# Patient Record
Sex: Female | Born: 1970 | Race: Black or African American | Hispanic: No | Marital: Single | Smoking: Never smoker
Health system: Southern US, Community
[De-identification: ages and names within clinical notes are randomized; demographics above are authoritative.]

## PROBLEM LIST (undated history)

## (undated) DIAGNOSIS — E669 Obesity, unspecified: Secondary | ICD-10-CM

## (undated) HISTORY — PX: CHOLECYSTECTOMY: SHX55

---

## 2015-04-12 ENCOUNTER — Encounter (HOSPITAL_COMMUNITY): Payer: Self-pay

## 2015-04-12 ENCOUNTER — Emergency Department (HOSPITAL_COMMUNITY): Payer: Self-pay

## 2015-04-12 ENCOUNTER — Emergency Department (HOSPITAL_COMMUNITY)
Admission: EM | Admit: 2015-04-12 | Discharge: 2015-04-12 | Disposition: A | Discharge status: Home / Self Care | Payer: Self-pay | Attending: Emergency Medicine | Admitting: Emergency Medicine

## 2015-04-12 DIAGNOSIS — R809 Proteinuria, unspecified: Secondary | ICD-10-CM | POA: Insufficient documentation

## 2015-04-12 DIAGNOSIS — R300 Dysuria: Secondary | ICD-10-CM | POA: Insufficient documentation

## 2015-04-12 DIAGNOSIS — K047 Periapical abscess without sinus: Secondary | ICD-10-CM | POA: Insufficient documentation

## 2015-04-12 DIAGNOSIS — J40 Bronchitis, not specified as acute or chronic: Secondary | ICD-10-CM

## 2015-04-12 DIAGNOSIS — J209 Acute bronchitis, unspecified: Secondary | ICD-10-CM | POA: Insufficient documentation

## 2015-04-12 LAB — URINALYSIS, ROUTINE W REFLEX MICROSCOPIC
BILIRUBIN URINE: NEGATIVE
GLUCOSE, UA: NEGATIVE mg/dL
HGB URINE DIPSTICK: NEGATIVE
KETONES UR: NEGATIVE mg/dL
LEUKOCYTES UA: NEGATIVE
Nitrite: NEGATIVE
PH: 5 (ref 5.0–8.0)
PROTEIN: 100 mg/dL — AB
Specific Gravity, Urine: 1.036 — ABNORMAL HIGH (ref 1.005–1.030)

## 2015-04-12 LAB — URINE MICROSCOPIC-ADD ON

## 2015-04-12 MED ORDER — OXYCODONE-ACETAMINOPHEN 5-325 MG PO TABS: 1.0000 | ORAL_TABLET | ORAL | Status: DC | PRN

## 2015-04-12 MED ORDER — PREDNISONE 20 MG PO TABS: 40.0000 mg | ORAL_TABLET | Freq: Every day | ORAL | Status: DC

## 2015-04-12 MED ORDER — PENICILLIN V POTASSIUM 250 MG PO TABS: 500.0000 mg | ORAL_TABLET | Freq: Three times a day (TID) | ORAL | Status: DC

## 2015-04-12 NOTE — ED Notes (Signed)
Patient has a non productive cough and sinus congestion x 1 week. Patient c/o frontal headache and dental pain right lower and left upper x 1 month. Patient states she has intermittent facial swelling, but none today. Patient also c/o dysuria and urinary frequency x 2 months. Patient denies any vaginal discharge

## 2015-04-12 NOTE — ED Provider Notes (Signed)
CSN: [id number]     Arrival date & time 04/12/15  1552 History  By signing my name below, I, Freida Busman, attest that this documentation has been prepared under the direction and in the presence of non-physician practitioner, Arthor Captain, PA-C. Electronically Signed: Freida Busman, Scribe. 04/12/2015. 5:48 PM.    Chief Complaint  Patient presents with  . Cough  . Headache  . Dysuria  . Dental Pain    The history is provided by the patient. No language interpreter was used.     HPI Comments:  Carol Larson is a 45 y.o. female who presents to the Emergency Department with multiple complaints. Pt complains of a deep productive cough with clear phlegm x ~ 1 week. Pt notes her symptom was preceded by a sore throat that has resolved. She reports associated CP secondary to cough and fever with TMAX of 103. Pt notes sick contacts with similar symptoms; states her friend was diagnosed with the flu.   Pt also complains of right lower dental pain that is actively draining from site x ~1 month and intermittent episodes of dysuria x 2 months. LNMP was 03/13/15. Pt is not sexually active.  No alleviating factors noted.  History reviewed. No pertinent past medical history. Past Surgical History  Procedure Laterality Date  . Cholecystectomy     Family History  Problem Relation Age of Onset  . Hypertension Mother   . Diabetes Father    Social History  Substance Use Topics  . Smoking status: Never Smoker   . Smokeless tobacco: Never Used  . Alcohol Use: No   OB History    No data available     Review of Systems  Constitutional: Positive for fever. Negative for chills.  HENT: Positive for dental problem and sore throat (resolved).   Respiratory: Positive for cough.   Cardiovascular: Positive for chest pain (secondary to cough).  Genitourinary: Positive for dysuria.    Allergies  Phenergan  Home Medications   Prior to Admission medications   Not on File   BP 111/77 mmHg  Pulse  79  Temp(Src) 99.4 F (37.4 C) (Oral)  Resp 18  Ht  (1.727 m)  Wt 255 lb (115.667 kg)  BMI 38.78 kg/m2  SpO2 100%  LMP 03/13/2015 Physical Exam  Constitutional: She is oriented to person, place, and time. She appears well-developed and well-nourished. No distress.  HENT:  Head: Normocephalic and atraumatic.  Mouth/Throat: Oropharynx is clear and moist.  2nd right lower molar decayed under gumline. There is significant ginvigival erythema and swelling with exquisite ttp   Eyes: Conjunctivae are normal.  Cardiovascular: Normal rate, regular rhythm and normal heart sounds.   Pulmonary/Chest: Effort normal. No respiratory distress. She has no wheezes.  Pt with thick and rhonchorous cough  Abdominal: She exhibits no distension.  Neurological: She is alert and oriented to person, place, and time.  Skin: Skin is warm and dry.  Psychiatric: She has a normal mood and affect.  Nursing note and vitals reviewed.   ED Course  Procedures   DIAGNOSTIC STUDIES:  Oxygen Saturation is 100% on RA, normal by my interpretation.    COORDINATION OF CARE:  5:43 PM Discussed treatment plan with pt at bedside and pt agreed to plan.  Labs Review Labs Reviewed  URINALYSIS, ROUTINE W REFLEX MICROSCOPIC (NOT AT [facility])    Imaging Review No results found. I have personally reviewed and evaluated these images and lab results as part of my medical decision-making.   MDM  Final diagnoses:  Bronchitis  Dental infection  Dysuria  Proteinuria   Patient with toothache.  No gross abscess.  Exam unconcerning for Ludwig's angina or spread of infection.  Will treat with penicillin and pain medicine.  Urged patient to follow-up with dentist.     Pt CXR negative for acute infiltrate. Patients symptoms are consistent with URI, likely viral etiology. Discussed that antibiotics are not indicated for viral infections. Pt will be discharged with symptomatic treatment.  Verbalizes understanding and is  agreeable with plan. Pt is hemodynamically stable & in NAD prior to dc.  Discussed need for follow up regardig proteinuria  I personally performed the services described in this documentation, which was scribed in my presence. The recorded information has been reviewed and is accurate.       Arthor Captain, PA-C 04/12/15 2052  Azalia Bilis, MD 04/13/15 [phone]

## 2015-04-12 NOTE — Discharge Instructions (Signed)
Dental Abscess A dental abscess is a collection of pus in or around a tooth. CAUSES This condition is caused by a bacterial infection around the root of the tooth that involves the inner part of the tooth (pulp). It may result from:  Severe tooth decay.  Trauma to the tooth that allows bacteria to enter into the pulp, such as a broken or chipped tooth.  Severe gum disease around a tooth. SYMPTOMS Symptoms of this condition include:  Severe pain in and around the infected tooth.  Swelling and redness around the infected tooth, in the mouth, or in the face.  Tenderness.  Pus drainage.  Bad breath.  Bitter taste in the mouth.  Difficulty swallowing.  Difficulty opening the mouth.  Nausea.  Vomiting.  Chills.  Swollen neck glands.  Fever. DIAGNOSIS This condition is diagnosed with examination of the infected tooth. During the exam, your dentist may tap on the infected tooth. Your dentist will also ask about your medical and dental history and may order X-rays. TREATMENT This condition is treated by eliminating the infection. This may be done with:  Antibiotic medicine.  A root canal. This may be performed to save the tooth.  Pulling (extracting) the tooth. This may also involve draining the abscess. This is done if the tooth cannot be saved. HOME CARE INSTRUCTIONS  Take medicines only as directed by your dentist.  If you were prescribed antibiotic medicine, finish all of it even if you start to feel better.  Rinse your mouth (gargle) often with salt water to relieve pain or swelling.  Do not drive or operate heavy machinery while taking pain medicine.  Do not apply heat to the outside of your mouth.  Keep all follow-up visits as directed by your dentist. This is important. SEEK MEDICAL CARE IF:  Your pain is worse and is not helped by medicine. SEEK IMMEDIATE MEDICAL CARE IF:  You have a fever or chills.  Your symptoms suddenly get worse.  You have a  very bad headache.  You have problems breathing or swallowing.  You have trouble opening your mouth.  You have swelling in your neck or around your eye.   This information is not intended to replace advice given to you by your health care provider. Make sure you discuss any questions you have with your health care provider.   Document Released: 02/11/2005 Document Revised: 06/28/2014 Document Reviewed: 02/08/2014 Elsevier Interactive Patient Education 2016 [employer]  Upper Respiratory Infection, Adult Most upper respiratory infections (URIs) are a viral infection of the air passages leading to the lungs. A URI affects the nose, throat, and upper air passages. The most common type of URI is nasopharyngitis and is typically referred to as "the common cold." URIs run their course and usually go away on their own. Most of the time, a URI does not require medical attention, but sometimes a bacterial infection in the upper airways can follow a viral infection. This is called a secondary infection. Sinus and middle ear infections are common types of secondary upper respiratory infections. Bacterial pneumonia can also complicate a URI. A URI can worsen asthma and chronic obstructive pulmonary disease (COPD). Sometimes, these complications can require emergency medical care and may be life threatening.  CAUSES Almost all URIs are caused by viruses. A virus is a type of germ and can spread from one person to another.  RISKS FACTORS You may be at risk for a URI if:   You smoke.   You have chronic  heart or lung disease.  You have a weakened defense (immune) system.   You are very young or very old.   You have nasal allergies or asthma.  You work in crowded or poorly ventilated areas.  You work in health care facilities or schools. SIGNS AND SYMPTOMS  Symptoms typically develop 2-3 days after you come in contact with a cold virus. Most viral URIs last 7-10 days. However, viral URIs  from the influenza virus (flu virus) can last 14-18 days and are typically more severe. Symptoms may include:   Runny or stuffy (congested) nose.   Sneezing.   Cough.   Sore throat.   Headache.   Fatigue.   Fever.   Loss of appetite.   Pain in your forehead, behind your eyes, and over your cheekbones (sinus pain).  Muscle aches.  DIAGNOSIS  Your health care provider may diagnose a URI by:  Physical exam.  Tests to check that your symptoms are not due to another condition such as:  Strep throat.  Sinusitis.  Pneumonia.  Asthma. TREATMENT  A URI goes away on its own with time. It cannot be cured with medicines, but medicines may be prescribed or recommended to relieve symptoms. Medicines may help:  Reduce your fever.  Reduce your cough.  Relieve nasal congestion. HOME CARE INSTRUCTIONS   Take medicines only as directed by your health care provider.   Gargle warm saltwater or take cough drops to comfort your throat as directed by your health care provider.  Use a warm mist humidifier or inhale steam from a shower to increase air moisture. This may make it easier to breathe.  Drink enough fluid to keep your urine clear or pale yellow.   Eat soups and other clear broths and maintain good nutrition.   Rest as needed.   Return to work when your temperature has returned to normal or as your health care provider advises. You may need to stay home longer to avoid infecting others. You can also use a face mask and careful hand washing to prevent spread of the virus.  Increase the usage of your inhaler if you have asthma.   Do not use any tobacco products, including cigarettes, chewing tobacco, or electronic cigarettes. If you need help quitting, ask your health care provider. PREVENTION  The best way to protect yourself from getting a cold is to practice good hygiene.   Avoid oral or hand contact with people with cold symptoms.   Wash your hands  often if contact occurs.  There is no clear evidence that vitamin C, vitamin E, echinacea, or exercise reduces the chance of developing a cold. However, it is always recommended to get plenty of rest, exercise, and practice good nutrition.  SEEK MEDICAL CARE IF:   You are getting worse rather than better.   Your symptoms are not controlled by medicine.   You have chills.  You have worsening shortness of breath.  You have brown or red mucus.  You have yellow or brown nasal discharge.  You have pain in your face, especially when you bend forward.  You have a fever.  You have swollen neck glands.  You have pain while swallowing.  You have white areas in the back of your throat. SEEK IMMEDIATE MEDICAL CARE IF:   You have severe or persistent:  Headache.  Ear pain.  Sinus pain.  Chest pain.  You have chronic lung disease and any of the following:  Wheezing.  Prolonged cough.  Coughing up  blood.  A change in your usual mucus.  You have a stiff neck.  You have changes in your:  Vision.  Hearing.  Thinking.  Mood. MAKE SURE YOU:   Understand these instructions.  Will watch your condition.  Will get help right away if you are not doing well or get worse.   This information is not intended to replace advice given to you by your health care provider. Make sure you discuss any questions you have with your health care provider.   Document Released: 08/07/2000 Document Revised: 06/28/2014 Document Reviewed: 05/19/2013 Elsevier Interactive Patient Education 2016 [employer].  Proteinuria Proteinuria is a condition in which urine contains more protein than is normal. Proteinuria is either a sign that your body is producing too much protein or a sign that there is a problem with the kidneys. Healthy kidneys prevent most substances that the body needs, including proteins, from leaving the bloodstream and ending up in urine. CAUSES  Proteinuria may be  caused by a temporary event or condition such as stress, exercise, or fever, and go away on its own. Proteinuria may also be a symptom of a more serious condition or disease. Causes of proteinuria include:  A kidney disease caused by:  Diabetes.  High blood pressure (hypertension).   A disease that affects the immune system, such as lupus.  A genetic disease, such as Alport's syndrome.  Medicines that damage the kidneys, such as long-term nonsteroidal anti-inflammatory drugs (NSAIDs).  Poisoning or exposure to toxic substances.  A reoccurring kidney or urinary infection.  Excess protein production in the body caused by:  Multiple myeloma.  Amyloidosis. SYMPTOMS You may have proteinuria without having noticeable symptoms. If there is a large amount of protein in your urine, your urine may look foamy. You may also notice swelling (edema) in your hands, feet, abdomen, or face. DIAGNOSIS To determine whether you have proteinuria, you will need to provide a urine sample. Your urine will then be tested for too much protein and the main blood protein albumin. If your test shows that you have proteinuria, you may need to take additional tests to determine its cause, how much protein is in your urine, and what type of protein is being lost. Tests may include:  Blood tests.  Urine tests.  A blood pressure measurement.  Imaging tests. TREATMENT  Treatment will depend on the cause of your proteinuria. Your caregiver will discuss treatment options with you after you have been diagnosed. If your proteinuria is mild or temporary, no treatment may be necessary. HOME CARE INSTRUCTIONS Ask your caregiver if monitoring the level of protein in your urine at home using simple testing strips is appropriate for you. Early detection of proteinuria can lead to early and often successful treatment of the condition causing it.   This information is not intended to replace advice given to you by your  health care provider. Make sure you discuss any questions you have with your health care provider.   Document Released: 04/03/2005 Document Revised: 11/06/2011 Document Reviewed: 07/12/2011 Elsevier Interactive Patient Education 2016 [place]  [address]  [place], [place] [address]  Phone [phone]  [facility] in [place], [place], exemplifies the [employer] vision to improve the health and quality of life of all [place] by Regulatory affairs officer with a passion to care for the  underserved and by leading the nation in community-based, service learning oral health education. We are committed to offering comprehensive general dental services for adults, children and special needs patients in a safe, caring and professional setting.  Appointments: Our clinic is open Monday through Friday 8:00 a.m. until 5:00 p.m. The amount of time scheduled for an appointment depends on the patients specific needs. We ask that you keep your appointed time for care or provide 24-hour notice of all appointment changes. Parents or legal guardians must accompany minor children.  Payment for Services: Medicaid and other insurance plans are welcome. Payment for services is due when services are rendered and may be made by cash or credit card. If you have dental insurance, we will assist you with your claim submission.   Emergencies: Emergency services will be provided Monday through Friday on a walk-in basis. Please arrive early for emergency services. After hours emergency services will be provided for patients of record as required.  Services:  [facility] Dentistry  Oral Surgery - Extractions  Root Canals  Sealants and Tooth Colored Fillings  Crowns and Bridges   Dentures and Partial Dentures  Implant Services  Periodontal Services and Cleanings  Cosmetic Statistician  [facility]

## 2015-04-14 LAB — URINE CULTURE: Special Requests: NORMAL

## 2015-05-17 ENCOUNTER — Telehealth (HOSPITAL_BASED_OUTPATIENT_CLINIC_OR_DEPARTMENT_OTHER): Payer: Self-pay | Admitting: Emergency Medicine

## 2015-09-01 ENCOUNTER — Emergency Department (HOSPITAL_COMMUNITY): Payer: Self-pay

## 2015-09-01 ENCOUNTER — Inpatient Hospital Stay (HOSPITAL_COMMUNITY)
Admission: EM | Admit: 2015-09-01 | Discharge: 2015-09-05 | DRG: 872 | Disposition: A | Discharge status: Home / Self Care | Payer: Self-pay | Attending: Internal Medicine | Admitting: Internal Medicine

## 2015-09-01 ENCOUNTER — Encounter (HOSPITAL_COMMUNITY): Payer: Self-pay | Admitting: *Deleted

## 2015-09-01 DIAGNOSIS — Z9884 Bariatric surgery status: Secondary | ICD-10-CM

## 2015-09-01 DIAGNOSIS — D649 Anemia, unspecified: Secondary | ICD-10-CM | POA: Diagnosis present

## 2015-09-01 DIAGNOSIS — D72829 Elevated white blood cell count, unspecified: Secondary | ICD-10-CM | POA: Insufficient documentation

## 2015-09-01 DIAGNOSIS — G4733 Obstructive sleep apnea (adult) (pediatric): Secondary | ICD-10-CM | POA: Diagnosis present

## 2015-09-01 DIAGNOSIS — Z6841 Body Mass Index (BMI) 40.0 and over, adult: Secondary | ICD-10-CM

## 2015-09-01 DIAGNOSIS — K029 Dental caries, unspecified: Secondary | ICD-10-CM | POA: Diagnosis present

## 2015-09-01 DIAGNOSIS — E876 Hypokalemia: Secondary | ICD-10-CM | POA: Diagnosis present

## 2015-09-01 DIAGNOSIS — E662 Morbid (severe) obesity with alveolar hypoventilation: Secondary | ICD-10-CM | POA: Diagnosis present

## 2015-09-01 DIAGNOSIS — A419 Sepsis, unspecified organism: Principal | ICD-10-CM | POA: Diagnosis present

## 2015-09-01 DIAGNOSIS — L03116 Cellulitis of left lower limb: Secondary | ICD-10-CM | POA: Diagnosis present

## 2015-09-01 DIAGNOSIS — K047 Periapical abscess without sinus: Secondary | ICD-10-CM | POA: Diagnosis present

## 2015-09-01 DIAGNOSIS — E611 Iron deficiency: Secondary | ICD-10-CM | POA: Diagnosis present

## 2015-09-01 DIAGNOSIS — R509 Fever, unspecified: Secondary | ICD-10-CM | POA: Insufficient documentation

## 2015-09-01 HISTORY — DX: Obesity, unspecified: E66.9

## 2015-09-01 LAB — I-STAT CG4 LACTIC ACID, ED
LACTIC ACID, VENOUS: 2.37 mmol/L — AB (ref 0.5–1.9)
LACTIC ACID, VENOUS: 2.87 mmol/L — AB (ref 0.5–1.9)
LACTIC ACID, VENOUS: 2.15 mmol/L — AB (ref 0.5–1.9)

## 2015-09-01 LAB — CBC
HEMATOCRIT: 23.7 % — AB (ref 36.0–46.0)
HEMOGLOBIN: 6.7 g/dL — AB (ref 12.0–15.0)
MCH: 20.6 pg — ABNORMAL LOW (ref 26.0–34.0)
MCHC: 28.3 g/dL — ABNORMAL LOW (ref 30.0–36.0)
MCV: 72.7 fL — AB (ref 78.0–100.0)
Platelets: 247 10*3/uL (ref 150–400)
RBC: 3.26 MIL/uL — AB (ref 3.87–5.11)
RDW: 20 % — AB (ref 11.5–15.5)
WBC: 11.7 10*3/uL — AB (ref 4.0–10.5)

## 2015-09-01 LAB — IRON AND TIBC
Iron: 12 ug/dL — ABNORMAL LOW (ref 28–170)
SATURATION RATIOS: 3 % — AB (ref 10.4–31.8)
TIBC: 427 ug/dL (ref 250–450)
UIBC: 415 ug/dL

## 2015-09-01 LAB — ABO/RH: ABO/RH(D): O POS

## 2015-09-01 LAB — BASIC METABOLIC PANEL
ANION GAP: 7 (ref 5–15)
BUN: 6 mg/dL (ref 6–20)
CHLORIDE: 109 mmol/L (ref 101–111)
CO2: 17 mmol/L — ABNORMAL LOW (ref 22–32)
Calcium: 8.5 mg/dL — ABNORMAL LOW (ref 8.9–10.3)
Creatinine, Ser: 0.63 mg/dL (ref 0.44–1.00)
Glucose, Bld: 133 mg/dL — ABNORMAL HIGH (ref 65–99)
POTASSIUM: 3.8 mmol/L (ref 3.5–5.1)
SODIUM: 133 mmol/L — AB (ref 135–145)

## 2015-09-01 LAB — CBG MONITORING, ED: Glucose-Capillary: 133 mg/dL — ABNORMAL HIGH (ref 65–99)

## 2015-09-01 LAB — FERRITIN: FERRITIN: 3 ng/mL — AB (ref 11–307)

## 2015-09-01 LAB — RETICULOCYTES
RBC.: 3.31 MIL/uL — AB (ref 3.87–5.11)
RETIC COUNT ABSOLUTE: 36.4 10*3/uL (ref 19.0–186.0)
RETIC CT PCT: 1.1 % (ref 0.4–3.1)

## 2015-09-01 LAB — I-STAT BETA HCG BLOOD, ED (MC, WL, AP ONLY)

## 2015-09-01 LAB — I-STAT TROPONIN, ED: TROPONIN I, POC: 0 ng/mL (ref 0.00–0.08)

## 2015-09-01 LAB — FOLATE: FOLATE: 7.4 ng/mL (ref >5.9)

## 2015-09-01 LAB — PREPARE RBC (CROSSMATCH)

## 2015-09-01 LAB — VITAMIN B12: Vitamin B-12: 71 pg/mL — ABNORMAL LOW (ref 180–914)

## 2015-09-01 MED ORDER — ENOXAPARIN SODIUM 40 MG/0.4ML ~~LOC~~ SOLN
40.0000 mg | Freq: Every day | SUBCUTANEOUS | Status: DC
  Administered 2015-09-02 – 2015-09-05 (×4): 40 mg via SUBCUTANEOUS
  Filled 2015-09-01 (×4): qty 0.4

## 2015-09-01 MED ORDER — IBUPROFEN 800 MG PO TABS
800.0000 mg | ORAL_TABLET | Freq: Once | ORAL | Status: AC
  Administered 2015-09-01: 800 mg via ORAL
  Filled 2015-09-01: qty 1

## 2015-09-01 MED ORDER — SODIUM CHLORIDE 0.9 % IV BOLUS (SEPSIS): 1000.0000 mL | Freq: Once | INTRAVENOUS | Status: DC

## 2015-09-01 MED ORDER — ACETAMINOPHEN 500 MG PO TABS
1000.0000 mg | ORAL_TABLET | Freq: Once | ORAL | Status: AC
  Administered 2015-09-01: 1000 mg via ORAL
  Filled 2015-09-01: qty 2

## 2015-09-01 MED ORDER — ACETAMINOPHEN 325 MG PO TABS
ORAL_TABLET | ORAL | Status: AC
  Administered 2015-09-02: 650 mg via ORAL
  Filled 2015-09-01: qty 2

## 2015-09-01 MED ORDER — SODIUM CHLORIDE 0.9 % IV SOLN
2000.0000 mg | Freq: Once | INTRAVENOUS | Status: AC
  Administered 2015-09-01: 2000 mg via INTRAVENOUS
  Filled 2015-09-01: qty 2000

## 2015-09-01 MED ORDER — ACETAMINOPHEN 325 MG PO TABS
650.0000 mg | ORAL_TABLET | Freq: Once | ORAL | Status: AC | PRN
  Administered 2015-09-01: 650 mg via ORAL

## 2015-09-01 MED ORDER — SODIUM CHLORIDE 0.9% FLUSH
3.0000 mL | Freq: Two times a day (BID) | INTRAVENOUS | Status: DC
  Administered 2015-09-02 – 2015-09-05 (×7): 3 mL via INTRAVENOUS

## 2015-09-01 MED ORDER — SODIUM CHLORIDE 0.9 % IV BOLUS (SEPSIS)
1000.0000 mL | Freq: Once | INTRAVENOUS | Status: AC
  Administered 2015-09-01: 1000 mL via INTRAVENOUS

## 2015-09-01 MED ORDER — FUROSEMIDE 10 MG/ML IJ SOLN
20.0000 mg | Freq: Once | INTRAMUSCULAR | Status: AC
  Administered 2015-09-02: 20 mg via INTRAVENOUS
  Filled 2015-09-01: qty 2

## 2015-09-01 MED ORDER — PIPERACILLIN-TAZOBACTAM 3.375 G IVPB 30 MIN
3.3750 g | Freq: Once | INTRAVENOUS | Status: AC
  Administered 2015-09-01: 3.375 g via INTRAVENOUS
  Filled 2015-09-01: qty 50

## 2015-09-01 MED ORDER — ONDANSETRON HCL 4 MG PO TABS: 4.0000 mg | ORAL_TABLET | Freq: Four times a day (QID) | ORAL | Status: DC | PRN

## 2015-09-01 MED ORDER — HYDROCODONE-ACETAMINOPHEN 5-325 MG PO TABS
1.0000 | ORAL_TABLET | Freq: Four times a day (QID) | ORAL | Status: DC | PRN
  Administered 2015-09-02 (×3): 1 via ORAL
  Filled 2015-09-01 (×3): qty 1

## 2015-09-01 MED ORDER — ALBUTEROL SULFATE (2.5 MG/3ML) 0.083% IN NEBU: 2.5000 mg | INHALATION_SOLUTION | RESPIRATORY_TRACT | Status: DC | PRN

## 2015-09-01 MED ORDER — PIPERACILLIN-TAZOBACTAM 3.375 G IVPB
3.3750 g | Freq: Three times a day (TID) | INTRAVENOUS | Status: DC
  Administered 2015-09-02 – 2015-09-05 (×11): 3.375 g via INTRAVENOUS
  Filled 2015-09-01 (×12): qty 50

## 2015-09-01 MED ORDER — SODIUM CHLORIDE 0.9 % IV SOLN
INTRAVENOUS | Status: DC
  Administered 2015-09-02: 05:00:00 via INTRAVENOUS

## 2015-09-01 MED ORDER — SODIUM CHLORIDE 0.9 % IV SOLN
Freq: Once | INTRAVENOUS | Status: AC
  Administered 2015-09-02: 02:00:00 via INTRAVENOUS

## 2015-09-01 MED ORDER — VANCOMYCIN HCL IN DEXTROSE 1-5 GM/200ML-% IV SOLN: 1000.0000 mg | Freq: Once | INTRAVENOUS | Status: DC

## 2015-09-01 MED ORDER — ONDANSETRON HCL 4 MG/2ML IJ SOLN: 4.0000 mg | Freq: Four times a day (QID) | INTRAMUSCULAR | Status: DC | PRN

## 2015-09-01 MED ORDER — VANCOMYCIN HCL IN DEXTROSE 1-5 GM/200ML-% IV SOLN
1000.0000 mg | Freq: Three times a day (TID) | INTRAVENOUS | Status: DC
  Administered 2015-09-02 – 2015-09-04 (×9): 1000 mg via INTRAVENOUS
  Filled 2015-09-01 (×12): qty 200

## 2015-09-01 NOTE — ED Notes (Signed)
Pt refused collection of stool for occult blood card. Dr. Karma GanjaLinker notified,

## 2015-09-01 NOTE — ED Notes (Signed)
Dr. Smith at bedside.

## 2015-09-01 NOTE — ED Notes (Signed)
Pt reports feeling fine earlier, was getting ready for work and then onset of generalized fatigue and chills. Pt then had onset of leg swelling and sob. Severe swelling noted to legs, more severe on left. Appears very lethargic at triage. No resp distress noted, ekg done.

## 2015-09-01 NOTE — H&P (Addendum)
History and Physical    Carol PonderMoenik Durrell ZOX:[id number] DOB: Nov 17, 1970 DOA: 09/01/2015  Referring MD/NP/PA: Dr. Karma GanjaLinker PCP: No PCP Per Patient  Patient coming from: Home  Chief Complaint: Feeling really weak and cold  HPI: Carol Larson is a 45 y.o. female with medical history significant of morbid obesity, s/p gastric bypass, OSA not on CPAP who presents with complaints of feeling really weak and cold. Patient states that she was in her normal state of health up until later this afternoon where she had acute onset of subjective fever, chills, fatigue, and had appropriate left lower leg swelling and redness. Unable to try anything to relieve symptoms prior to coming in for further evaluation. Patient reports this is happening actually several times in the past every 2-3 months for which she is has had to be hospitalized. 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 However, she notes each time this has happened before in the past they've checked her for possibility of a clot and no clot has been present. Since starting her new job she's gained at least 30 pounds in the last 7 months. Patient also notes that she had a gastric bypass back in 2005 and that she has been anemic with reported blood counts as low as 4.3 in the past not receiving blood transfusions for fear of "receiving dirty blood". Patient also complains of right molar tooth pain, intermittent shortness of breath with exertion.  ED Course: On admission into the emergency department patient was seen to be febrile up to 102.41F with WBC 11.7, and a lactic acid of 2.87. Sepsis protocol initiated and patient started on vancomycin and Zosyn. Evaluation of the patient's left lower leg lead them to believe that symptoms were secondary to a cellulitis. [facility] called to admitt the patient.  Review of Systems: As per HPI otherwise 10 point review of systems negative.   Past Medical History  Diagnosis Date  .  Obesity     Past Surgical History  Procedure Laterality Date  . Cholecystectomy       reports that she has never smoked. She has never used smokeless tobacco. She reports that she does not drink alcohol or use illicit drugs.  Allergies  Allergen Reactions  . Latex Itching  . Phenergan [Promethazine Hcl] Other (See Comments)    Involuntary movements    Family History  Problem Relation Age of Onset  . Hypertension Mother   . Diabetes Father     Prior to Admission medications   Not on File    Physical Exam:  Constitutional: Obese female in moderate distress complaining of left leg pain  Filed Vitals:   09/01/15 2249 09/01/15 2253 09/01/15 2255 09/01/15 2300  BP: 93/52 109/59 110/47 116/44  Pulse: 102 112 106 98  Temp:      TempSrc:      Resp: 18 19 29 25   Height:      Weight:      SpO2: 100% 100% 100% 99%   Eyes: PERRL, lids and conjunctivae normal ENMT: Mucous membranes are moist. Posterior pharynx clear of any exudate or lesions.Poor overall dentition with impacted right molar with swelling.  Neck: normal, supple, no masses, no thyromegaly Respiratory: Decreased aeration, no wheezes or crackles. Normal respiratory effort. No accessory muscle use.  Cardiovascular: Tachycardic, no murmurs / rubs / gallops. Left lower extremity 3+ pedal edema and 2+ pedal edema on right lower extremity. 2+ pedal pulses. No carotid bruits.  Abdomen: no tenderness, no masses  palpated. No hepatosplenomegaly. Bowel sounds positive.  Musculoskeletal: no clubbing / cyanosis. No joint deformity upper and lower extremities. Good ROM, no contractures. Normal muscle tone.  Skin: no rashes, lesions, ulcers. No induration Neurologic: CN 2-12 grossly intact. Sensation intact, DTR normal. Strength 5/5 in all 4.  Psychiatric: Normal judgment and insight. Alert and oriented x 3. Normal mood.     Labs on Admission: I have personally reviewed following labs and imaging studies  CBC:  Recent  Labs Lab 09/01/15 1836  WBC 11.7*  HGB 6.7*  HCT 23.7*  MCV 72.7*  PLT 247   Basic Metabolic Panel:  Recent Labs Lab 09/01/15 1836  NA 133*  K 3.8  CL 109  CO2 17*  GLUCOSE 133*  BUN 6  CREATININE 0.63  CALCIUM 8.5*   GFR: Estimated Creatinine Clearance: 126.3 mL/min (by C-G formula based on Cr of 0.63). Liver Function Tests: No results for input(s): AST, ALT, ALKPHOS, BILITOT, PROT, ALBUMIN in the last 168 hours. No results for input(s): LIPASE, AMYLASE in the last 168 hours. No results for input(s): AMMONIA in the last 168 hours. Coagulation Profile: No results for input(s): INR, PROTIME in the last 168 hours. Cardiac Enzymes: No results for input(s): CKTOTAL, CKMB, CKMBINDEX, TROPONINI in the last 168 hours. BNP (last 3 results) No results for input(s): PROBNP in the last 8760 hours. HbA1C: No results for input(s): HGBA1C in the last 72 hours. CBG:  Recent Labs Lab 09/01/15 1836  GLUCAP 133*   Lipid Profile: No results for input(s): CHOL, HDL, LDLCALC, TRIG, CHOLHDL, LDLDIRECT in the last 72 hours. Thyroid Function Tests: No results for input(s): TSH, T4TOTAL, FREET4, T3FREE, THYROIDAB in the last 72 hours. Anemia Panel:  Recent Labs  09/01/15 2034  VITAMINB12 71*  FOLATE 7.4  FERRITIN 3*  TIBC 427  IRON 12*  RETICCTPCT 1.1   Urine analysis:    Component Value Date/Time   COLORURINE AMBER* 04/12/2015 1621   APPEARANCEUR CLOUDY* 04/12/2015 1621   LABSPEC 1.036* 04/12/2015 1621   PHURINE 5.0 04/12/2015 1621   GLUCOSEU NEGATIVE 04/12/2015 1621   HGBUR NEGATIVE 04/12/2015 1621   BILIRUBINUR NEGATIVE 04/12/2015 1621   KETONESUR NEGATIVE 04/12/2015 1621   PROTEINUR 100* 04/12/2015 1621   NITRITE NEGATIVE 04/12/2015 1621   LEUKOCYTESUR NEGATIVE 04/12/2015 1621   Sepsis Labs: No results found for this or any previous visit (from the past 240 hour(s)).   Radiological Exams on Admission: Dg Chest 2 View  09/01/2015  CLINICAL DATA:  Shortness of  breath. Fatigue, chills, bilateral lower extremity swelling today. EXAM: CHEST  2 VIEW COMPARISON:  04/12/2015 FINDINGS: Borderline cardiomegaly. Mediastinal contours are unchanged. Central bronchial thickening and perihilar atelectasis is chronic and unchanged from prior. There is no pulmonary edema, confluent airspace disease, pleural effusion or pneumothorax. Unchanged osseous structures. IMPRESSION: Chronic bronchial thickening.  No new abnormalities seen. Electronically Signed   By: Rubye OaksMelanie  Ehinger M.D.   On: 09/01/2015 21:07    EKG: Independently reviewed.   Assessment/Plan Sepsis secondary to cellulitis of the left leg: Acute. Patient found to be febrile to 102.71F, WBC 11.7, and lactic acid 2.87. Patient with significant redness and erythema noted of the left leg. Sepsis protocol initiated. - Admit to a telemetry bed - Follow up blood cultures - Continue empiric antibiotics of vancomycin and Zosyn - Trend lactic acid level - Hydrocodone prn pain   Symptomatic Anemia: Patient with a hemoglobin of 6.7 on admission with complaints of shortness of breath and fatigue.   Found to be  vitamin B12 and iron deficient. - Transfusing 1 unit of PRBC - Check H&H one hour posttransfusion - Given previous history of gastric bypass will likely need IV supplementation for B12 and iron  Dental infection and pain: Acute. Gum swelling around the right lower molar which has dental caries. - Advised patient of the need to follow-up with oral surgery as an outpatient  Status post gastric bypass: Patient had a seizure back in 2005 notes that she's had at least a 30 pound weight gain over the last 7 months.  Obstructive sleep apnea Hx: Patient previously noted having a CPAP machine years ago, but currently no longer uses. - Continuous pulse oximetry  Social work consult: This patient lacks primary care provider and adequate follow-up.    DVT prophylaxis: Ted hoses Code Status: Full Family  Communication: Discussed overall plan with the patient and mother present at bedside. Disposition Plan: Possible discharge home in 2-3 days Consults called: none Admission status:  Inpatient telemetry  Clydie Braun MD [facility] Pager [phone]  If 7PM-7AM, please contact night-coverage [email or web address] Password [email or web address]  09/01/2015, 11:18 PM

## 2015-09-01 NOTE — ED Notes (Signed)
Patient transported to X-ray 

## 2015-09-01 NOTE — ED Notes (Signed)
Dr. Linker at bedside  

## 2015-09-01 NOTE — ED Notes (Signed)
Security moved pt.s car up from the blocking the entrance.  Pt. Given her keys  And informed where her car is.

## 2015-09-01 NOTE — Progress Notes (Signed)
Pharmacy Antibiotic Note  Carol Larson is a 45 y.o. female admitted on 09/01/2015 with sepsis and cellulitis.  Pharmacy has been consulted for vancomycin and zosyn dosing. Tmax is 102.8 and WBC is elevated at 11.7. Scr is WNL and lactic is elevated at 2.37.   Plan: - Vanc 2gm IV x 1 then 1gm IV Q8H - Zosyn 3.375gm IV Q8H (4 hr inf) - F/u renal fxn, C&S, clinical status and trough at SS  Height: 5\' 8"  (172.7 cm) Weight: 275 lb (124.739 kg) IBW/kg (Calculated) : 63.9  Temp (24hrs), Avg:102.8 F (39.3 C), Min:102.8 F (39.3 C), Max:102.8 F (39.3 C)   Recent Labs Lab 09/01/15 1836 09/01/15 1846  WBC 11.7*  --   CREATININE 0.63  --   LATICACIDVEN  --  2.37*    Estimated Creatinine Clearance: 126.3 mL/min (by C-G formula based on Cr of 0.63).    Allergies  Allergen Reactions  . Phenergan [Promethazine Hcl] Other (See Comments)    Involuntary movements    Antimicrobials this admission: Vanc 7/7>> Zosyn 7/7>>  Dose adjustments this admission: N/A  Microbiology results: Pending  Thank you for allowing pharmacy to be a part of this patient's care.  Carol Larson, Carol Larson 09/01/2015 8:19 PM

## 2015-09-01 NOTE — ED Notes (Signed)
Reported to Italyhad Grose, Consulting civil engineerCharge RN,  That pt.s lactic acid is 2.37

## 2015-09-01 NOTE — ED Provider Notes (Signed)
CSN: [id number]     Arrival date & time 09/01/15  [id number] History   First MD Initiated Contact with Patient 09/01/15 1950     Chief Complaint  Patient presents with  . Leg Swelling  . Weakness     (Consider location/radiation/quality/duration/timing/severity/associated sxs/prior Treatment) HPI  Pt presenting with c/o fever, fatigue as well as swelling of left lower extremity swelling and redness. Pt states she felt fine this morning and then later in the day had acute onset of fever/chills, fatigue and felt that her legs were swelling, noted left leg was red and swollen and painful.  She denies cough, no sore throat, no dysuria.   She states this has happened to her in the past, years ago, and she was hospitalized.  She states that during the day her legs normally swell, but are improved after being in bed at night.  She states the left lower extremity normally swells more than the right.  She has not had any treatment prior to arrival.  She states she is normally anemic- states her hemoglobin has been as low as 4, she has never had a blood transfusion- states she refuses them- does not know the etiology of her anemia.  Denies heavy periods, no melena. She states she has just moved to the area from near [place].  She also states she occasionally takes a friends' lasix for her leg swelling and sees a lot of improvement.   There are no other associated systemic symptoms, there are no other alleviating or modifying factors.   Past Medical History  Diagnosis Date  . Obesity    Past Surgical History  Procedure Laterality Date  . Cholecystectomy     Family History  Problem Relation Age of Onset  . Hypertension Mother   . Diabetes Father    Social History  Substance Use Topics  . Smoking status: Never Smoker   . Smokeless tobacco: Never Used  . Alcohol Use: No   OB History    No data available     Review of Systems  ROS reviewed and all otherwise negative except for mentioned in  HPI    Allergies  Latex and Phenergan  Home Medications   Prior to Admission medications   Not on File   BP 119/66 mmHg  Pulse 98  Temp(Src) 102.6 F (39.2 C) (Oral)  Resp 26  Ht 5\' 8"  (1.727 m)  Wt 124.739 kg  BMI 41.82 kg/m2  SpO2 100%  LMP 07/25/2015  Vitals reviewed Physical Exam  Physical Examination: General appearance - alert, well appearing, and in no distress Mental status - alert, oriented to person, place, and time Eyes - no conjunctival injection no scleral icterus Chest - clear to auscultation, no wheezes, rales or rhonchi, symmetric air entry Heart - normal rate, regular rhythm, normal S1, S2, no murmurs, rubs, clicks or gallops Abdomen - soft, nontender, nondistended, no masses or organomegaly Neurological - alert, oriented, normal speech Musculoskeletal - no joint tenderness, deformity or swelling Extremities - peripheral pulses normal, bilateral 2+ lower extremity edema, left lower extremity with 3-4+ edema and dark erythema up to knee, warm to touch Skin - normal coloration and turgor- other than left lower extremity with ertyhema, warmth and tenderness of left lower extremity  ED Course  Procedures (including critical care time)  CRITICAL CARE Performed by: Ethelda ChickLINKER,Raeshawn Tafolla K Total critical care time: 40 minutes Critical care time was exclusive of separately billable procedures and treating other patients. Critical care was necessary to treat or prevent  imminent or life-threatening deterioration. Critical care was time spent personally by me on the following activities: development of treatment plan with patient and/or surrogate as well as nursing, discussions with consultants, evaluation of patient's response to treatment, examination of patient, obtaining history from patient or surrogate, ordering and performing treatments and interventions, ordering and review of laboratory studies, ordering and review of radiographic studies, pulse oximetry and  re-evaluation of patient's condition. Labs Review Labs Reviewed  BASIC METABOLIC PANEL - Abnormal; Notable for the following:    Sodium 133 (*)    CO2 17 (*)    Glucose, Bld 133 (*)    Calcium 8.5 (*)    All other components within normal limits  CBC - Abnormal; Notable for the following:    WBC 11.7 (*)    RBC 3.26 (*)    Hemoglobin 6.7 (*)    HCT 23.7 (*)    MCV 72.7 (*)    MCH 20.6 (*)    MCHC 28.3 (*)    RDW 20.0 (*)    All other components within normal limits  VITAMIN B12 - Abnormal; Notable for the following:    Vitamin B-12 71 (*)    All other components within normal limits  IRON AND TIBC - Abnormal; Notable for the following:    Iron 12 (*)    Saturation Ratios 3 (*)    All other components within normal limits  FERRITIN - Abnormal; Notable for the following:    Ferritin 3 (*)    All other components within normal limits  RETICULOCYTES - Abnormal; Notable for the following:    RBC. 3.31 (*)    All other components within normal limits  CBG MONITORING, ED - Abnormal; Notable for the following:    Glucose-Capillary 133 (*)    All other components within normal limits  I-STAT CG4 LACTIC ACID, ED - Abnormal; Notable for the following:    Lactic Acid, Venous 2.37 (*)    All other components within normal limits  I-STAT CG4 LACTIC ACID, ED - Abnormal; Notable for the following:    Lactic Acid, Venous 2.87 (*)    All other components within normal limits  CULTURE, BLOOD (ROUTINE X 2)  CULTURE, BLOOD (ROUTINE X 2)  URINE CULTURE  FOLATE  URINALYSIS, ROUTINE W REFLEX MICROSCOPIC (NOT AT [facility])  OCCULT BLOOD X 1 CARD TO LAB, STOOL  I-STAT BETA HCG BLOOD, ED ([facility], [facility], [facility] ONLY)  I-STAT TROPOININ, ED  I-STAT CG4 LACTIC ACID, ED  TYPE AND SCREEN  ABO/RH    Imaging Review Dg Chest 2 View  09/01/2015  CLINICAL DATA:  Shortness of breath. Fatigue, chills, bilateral lower extremity swelling today. EXAM: CHEST  2 VIEW COMPARISON:  04/12/2015 FINDINGS: Borderline  cardiomegaly. Mediastinal contours are unchanged. Central bronchial thickening and perihilar atelectasis is chronic and unchanged from prior. There is no pulmonary edema, confluent airspace disease, pleural effusion or pneumothorax. Unchanged osseous structures. IMPRESSION: Chronic bronchial thickening.  No new abnormalities seen. Electronically Signed   By: Rubye Oaks M.D.   On: 09/01/2015 21:07   I have personally reviewed and evaluated these images and lab results as part of my medical decision-making.   EKG Interpretation   Date/Time:  Friday September 01 2015 18:30:58 EDT Ventricular Rate:  97 PR Interval:  116 QRS Duration: 80 QT Interval:  328 QTC Calculation: 416 R Axis:   -10 Text Interpretation:  Normal sinus rhythm Cannot rule out Anterior infarct  , age undetermined Abnormal ECG No significant change since  last tracing  Confirmed by [facility] IvINKER  MD, Laronica Bhagat [phone]([id number]) on 09/01/2015 7:17:51 PM      MDM   Final diagnoses:  Sepsis, due to unspecified organism (HCC)  Cellulitis of left lower extremity  Fever, unspecified fever cause  Leukocytosis  Anemia, unspecified anemia type    Pt presenting with c/o fever and left lower extremity swelling and erythema.  Pt states symptoms began acutely today.  Pt with fever on arrival.  Mild increase in lactate.  Normal blood pressure.  Mild leukocytosis.  Pt started on broad spectrum abx and given IV fluids.  Blood and urine, CXR obtained.  Pt admitted to [facility] for further management.  Of note, type and screen sent but patient refuses blood transfusion.  She also refused rectal exam.    10:12 PM d/w Dr. Katrinka BlazingSmith, [facility] for admission.  He will see patient in the ED.   10:38 PM pt refused to have hemoccult performed.   Jerelyn ScottMartha Linker, MD 09/01/15 [phone]

## 2015-09-02 DIAGNOSIS — L03116 Cellulitis of left lower limb: Secondary | ICD-10-CM | POA: Diagnosis present

## 2015-09-02 DIAGNOSIS — K047 Periapical abscess without sinus: Secondary | ICD-10-CM | POA: Diagnosis present

## 2015-09-02 DIAGNOSIS — G4733 Obstructive sleep apnea (adult) (pediatric): Secondary | ICD-10-CM | POA: Diagnosis present

## 2015-09-02 DIAGNOSIS — D649 Anemia, unspecified: Secondary | ICD-10-CM

## 2015-09-02 DIAGNOSIS — Z9884 Bariatric surgery status: Secondary | ICD-10-CM

## 2015-09-02 DIAGNOSIS — A419 Sepsis, unspecified organism: Principal | ICD-10-CM

## 2015-09-02 LAB — CBC
HCT: 24.6 % — ABNORMAL LOW (ref 36.0–46.0)
Hemoglobin: 7 g/dL — ABNORMAL LOW (ref 12.0–15.0)
MCH: 21.3 pg — AB (ref 26.0–34.0)
MCHC: 28.5 g/dL — ABNORMAL LOW (ref 30.0–36.0)
MCV: 74.8 fL — ABNORMAL LOW (ref 78.0–100.0)
PLATELETS: 202 10*3/uL (ref 150–400)
RBC: 3.29 MIL/uL — AB (ref 3.87–5.11)
RDW: 20.8 % — AB (ref 11.5–15.5)
WBC: 9.5 10*3/uL (ref 4.0–10.5)

## 2015-09-02 LAB — COMPREHENSIVE METABOLIC PANEL
ALK PHOS: 56 U/L (ref 38–126)
ALT: 14 U/L (ref 14–54)
AST: 25 U/L (ref 15–41)
Albumin: 2.7 g/dL — ABNORMAL LOW (ref 3.5–5.0)
Anion gap: 6 (ref 5–15)
BUN: 7 mg/dL (ref 6–20)
CALCIUM: 8 mg/dL — AB (ref 8.9–10.3)
CO2: 20 mmol/L — AB (ref 22–32)
CREATININE: 0.81 mg/dL (ref 0.44–1.00)
Chloride: 110 mmol/L (ref 101–111)
Glucose, Bld: 122 mg/dL — ABNORMAL HIGH (ref 65–99)
Potassium: 3.2 mmol/L — ABNORMAL LOW (ref 3.5–5.1)
Sodium: 136 mmol/L (ref 135–145)
Total Bilirubin: 0.6 mg/dL (ref 0.3–1.2)
Total Protein: 6 g/dL — ABNORMAL LOW (ref 6.5–8.1)

## 2015-09-02 LAB — PREPARE RBC (CROSSMATCH)

## 2015-09-02 LAB — LACTIC ACID, PLASMA
LACTIC ACID, VENOUS: 1.5 mmol/L (ref 0.5–1.9)
Lactic Acid, Venous: 1 mmol/L (ref 0.5–1.9)

## 2015-09-02 LAB — TSH: TSH: 1.174 u[IU]/mL (ref 0.350–4.500)

## 2015-09-02 MED ORDER — SODIUM CHLORIDE 0.9 % IV SOLN
510.0000 mg | Freq: Once | INTRAVENOUS | Status: AC
  Administered 2015-09-02: 510 mg via INTRAVENOUS
  Filled 2015-09-02 (×2): qty 17

## 2015-09-02 MED ORDER — CYANOCOBALAMIN 1000 MCG/ML IJ SOLN
1000.0000 ug | Freq: Every day | INTRAMUSCULAR | Status: DC
  Administered 2015-09-02 – 2015-09-05 (×4): 1000 ug via SUBCUTANEOUS
  Filled 2015-09-02 (×4): qty 1

## 2015-09-02 MED ORDER — ACETAMINOPHEN 325 MG PO TABS
650.0000 mg | ORAL_TABLET | Freq: Four times a day (QID) | ORAL | Status: DC | PRN
  Administered 2015-09-02 – 2015-09-04 (×3): 650 mg via ORAL
  Filled 2015-09-02 (×3): qty 2

## 2015-09-02 NOTE — ED Notes (Addendum)
Consent for blood obtained.

## 2015-09-02 NOTE — Progress Notes (Signed)
PROGRESS NOTE  Carol Larson  QMV:[id number] DOB: 1971-01-07 DOA: 09/01/2015 PCP: No PCP Per Patient Outpatient Specialists:  Subjective: Feels better but is still has redness and pain in her left lower extremity.  Brief Narrative:  45 year old female morbidly obese came in with symptoms of subjective fever chills and fatigue, left lower extremity cellulitis. Admitted for IV antibiotics.  Assessment & Plan:   Principal Problem:   Sepsis (HCC) Active Problems:   Cellulitis of leg, left   Symptomatic anemia   Dental infection   Status post gastric bypass for obesity   Obstructive sleep apnea   Sepsis -Patient found to be febrile to 102.67F, WBC 11.7, and lactic acid 2.87 and left lower extremity cellulitis. -Sepsis protocol initiated, this is likely secondary to the lower extremity cellulitis. -Blood cultures obtained, continue empiric antibiotics with vancomycin and Zosyn.  Left lower extremity cellulitis -Patient reported recurrent left lower extremity cellulitis, started on empiric vancomycin and Zosyn. -Control pain with hydrocodone, lactic acid normalized.. Leukocytosis improved since yesterday.  Symptomatic Anemia:  -Patient with a hemoglobin of 6.7 on admission with complaints of shortness of breath and fatigue.  -Found to be vitamin B12 and iron deficient. -Status post transfusion 1 unit of RBCs, hemoglobin increased to 7.0, I'll transfuse 1 more unit. -Given previous history of gastric bypass will likely need IV supplementation for B12 and iron  Dental infection and pain:  -Acute. Gum swelling around the right lower molar which has dental caries. -Advised patient of the need to follow-up with oral surgery as an outpatient  Status post gastric bypass:  -Patient had a seizure back in 2005 notes that she's had at least a 30 pound weight gain over the last 7 months.  Obstructive sleep apnea Hx:  -Patient previously noted having a CPAP machine years ago, but currently  no longer uses. -Continuous pulse oximetry   DVT prophylaxis:  Code Status: Full Code Family Communication:  Disposition Plan:  Diet: Diet Heart Room service appropriate?: Yes; Fluid consistency:: Thin  Consultants:   None  Procedures:   None  Antimicrobials:   IV vancomycin and Zosyn   Objective: Filed Vitals:   09/02/15 0145 09/02/15 0216 09/02/15 0439 09/02/15 0901  BP: 106/54 103/43 114/57 119/57  Pulse: 86 80 83 78  Temp: 99.3 F (37.4 C) 99.1 F (37.3 C) 99.4 F (37.4 C)   TempSrc: Oral Oral Oral   Resp: Height:      Weight:      SpO2: 100% 100% 100% 100%    Intake/Output Summary (Last 24 hours) at 09/02/15 1158 Last data filed at 09/02/15 0700  Gross per 24 hour  Intake   3412 ml  Output   1100 ml  Net   2312 ml   Filed Weights   09/01/15 1833 09/02/15 0104  Weight: 124.739 kg (275 lb) 132.087 kg (291 lb 3.2 oz)    Examination: General exam: Appears calm and comfortable  Respiratory system: Clear to auscultation. Respiratory effort normal. Cardiovascular system: S1 & S2 heard, RRR. No JVD, murmurs, rubs, gallops or clicks. No pedal edema. Gastrointestinal system: Abdomen is nondistended, soft and nontender. No organomegaly or masses felt. Normal bowel sounds heard. Central nervous system: Alert and oriented. No focal neurological deficits. Extremities: Symmetric 5 x 5 power. Skin: No rashes, lesions or ulcers Psychiatry: Judgement and insight appear normal. Mood & affect appropriate.   Data Reviewed: I have personally reviewed following labs and imaging studies  CBC:  Recent Labs Lab  09/01/15 1836 09/02/15 0813  WBC 11.7* 9.5  HGB 6.7* 7.0*  HCT 23.7* 24.6*  MCV 72.7* 74.8*  PLT 247 202   Basic Metabolic Panel:  Recent Labs Lab 09/01/15 1836 09/02/15 0813  NA 133* 136  K 3.8 3.2*  CL 109 110  CO2 17* 20*  GLUCOSE 133* 122*  BUN 6 7  CREATININE 0.63 0.81  CALCIUM 8.5* 8.0*   GFR: Estimated Creatinine Clearance:  123 mL/min (by C-G formula based on Cr of 0.81). Liver Function Tests:  Recent Labs Lab 09/02/15 0813  AST 25  ALT 14  ALKPHOS 56  BILITOT 0.6  PROT 6.0*  ALBUMIN 2.7*   No results for input(s): LIPASE, AMYLASE in the last 168 hours. No results for input(s): AMMONIA in the last 168 hours. Coagulation Profile: No results for input(s): INR, PROTIME in the last 168 hours. Cardiac Enzymes: No results for input(s): CKTOTAL, CKMB, CKMBINDEX, TROPONINI in the last 168 hours. BNP (last 3 results) No results for input(s): PROBNP in the last 8760 hours. HbA1C: No results for input(s): HGBA1C in the last 72 hours. CBG:  Recent Labs Lab 09/01/15 1836  GLUCAP 133*   Lipid Profile: No results for input(s): CHOL, HDL, LDLCALC, TRIG, CHOLHDL, LDLDIRECT in the last 72 hours. Thyroid Function Tests:  Recent Labs  09/02/15 0105  TSH 1.174   Anemia Panel:  Recent Labs  09/01/15 2034  VITAMINB12 71*  FOLATE 7.4  FERRITIN 3*  TIBC 427  IRON 12*  RETICCTPCT 1.1   Urine analysis:    Component Value Date/Time   COLORURINE AMBER* 04/12/2015 1621   APPEARANCEUR CLOUDY* 04/12/2015 1621   LABSPEC 1.036* 04/12/2015 1621   PHURINE 5.0 04/12/2015 1621   GLUCOSEU NEGATIVE 04/12/2015 1621   HGBUR NEGATIVE 04/12/2015 1621   BILIRUBINUR NEGATIVE 04/12/2015 1621   KETONESUR NEGATIVE 04/12/2015 1621   PROTEINUR 100* 04/12/2015 1621   NITRITE NEGATIVE 04/12/2015 1621   LEUKOCYTESUR NEGATIVE 04/12/2015 1621   Sepsis Labs: @LABRCNTIP (procalcitonin:4,lacticidven:4)  ) Recent Results (from the past 240 hour(s))  Blood Culture (routine x 2)     Status: None (Preliminary result)   Collection Time: 09/01/15  7:56 PM  Result Value Ref Range Status   Specimen Description BLOOD RIGHT FOREARM  Final   Special Requests BOTTLES DRAWN AEROBIC AND ANAEROBIC 5CC  Final   Culture NO GROWTH < 12 HOURS  Final   Report Status PENDING  Incomplete  Blood Culture (routine x 2)     Status: None  (Preliminary result)   Collection Time: 09/01/15  8:10 PM  Result Value Ref Range Status   Specimen Description BLOOD LEFT ANTECUBITAL  Final   Special Requests BOTTLES DRAWN AEROBIC AND ANAEROBIC 5CC  Final   Culture NO GROWTH < 12 HOURS  Final   Report Status PENDING  Incomplete     Invalid input(s): PROCALCITONIN, LACTICACIDVEN   Radiology Studies: Dg Chest 2 View  09/01/2015  CLINICAL DATA:  Shortness of breath. Fatigue, chills, bilateral lower extremity swelling today. EXAM: CHEST  2 VIEW COMPARISON:  04/12/2015 FINDINGS: Borderline cardiomegaly. Mediastinal contours are unchanged. Central bronchial thickening and perihilar atelectasis is chronic and unchanged from prior. There is no pulmonary edema, confluent airspace disease, pleural effusion or pneumothorax. Unchanged osseous structures. IMPRESSION: Chronic bronchial thickening.  No new abnormalities seen. Electronically Signed   By: Rubye OaksMelanie  Ehinger M.D.   On: 09/01/2015 21:07        Scheduled Meds: . enoxaparin (LOVENOX) injection  40 mg Subcutaneous Daily  . piperacillin-tazobactam (ZOSYN)  IV  3.375 g Intravenous Q8H  . sodium chloride flush  3 mL Intravenous Q12H  . vancomycin  1,000 mg Intravenous Q8H   Continuous Infusions: . sodium chloride 75 mL/hr at 09/02/15 0447     LOS: 1 day    Time spent: 35 minutes    Jeanmarc Viernes A, MD [facility] Pager [phone]  If 7PM-7AM, please contact night-coverage [email or web address] Password [email or web address] 09/02/2015, 11:58 AM     n

## 2015-09-03 DIAGNOSIS — R509 Fever, unspecified: Secondary | ICD-10-CM | POA: Insufficient documentation

## 2015-09-03 DIAGNOSIS — D649 Anemia, unspecified: Secondary | ICD-10-CM | POA: Insufficient documentation

## 2015-09-03 DIAGNOSIS — L03116 Cellulitis of left lower limb: Secondary | ICD-10-CM | POA: Insufficient documentation

## 2015-09-03 DIAGNOSIS — D72829 Elevated white blood cell count, unspecified: Secondary | ICD-10-CM | POA: Insufficient documentation

## 2015-09-03 LAB — TYPE AND SCREEN
ABO/RH(D): O POS
Antibody Screen: NEGATIVE
UNIT DIVISION: 0
UNIT DIVISION: 0

## 2015-09-03 LAB — BASIC METABOLIC PANEL
Anion gap: 6 (ref 5–15)
BUN: 6 mg/dL (ref 6–20)
CHLORIDE: 107 mmol/L (ref 101–111)
CO2: 22 mmol/L (ref 22–32)
Calcium: 8.6 mg/dL — ABNORMAL LOW (ref 8.9–10.3)
Creatinine, Ser: 0.8 mg/dL (ref 0.44–1.00)
GFR calc Af Amer: >60 mL/min (ref >60)
GFR calc non Af Amer: >60 mL/min (ref >60)
GLUCOSE: 95 mg/dL (ref 65–99)
POTASSIUM: 3.1 mmol/L — AB (ref 3.5–5.1)
SODIUM: 135 mmol/L (ref 135–145)

## 2015-09-03 LAB — CBC
HEMATOCRIT: 26.6 % — AB (ref 36.0–46.0)
Hemoglobin: 7.8 g/dL — ABNORMAL LOW (ref 12.0–15.0)
MCH: 22.5 pg — AB (ref 26.0–34.0)
MCHC: 29.3 g/dL — ABNORMAL LOW (ref 30.0–36.0)
MCV: 76.9 fL — AB (ref 78.0–100.0)
Platelets: 202 10*3/uL (ref 150–400)
RBC: 3.46 MIL/uL — ABNORMAL LOW (ref 3.87–5.11)
RDW: 21.5 % — AB (ref 11.5–15.5)
WBC: 7.5 10*3/uL (ref 4.0–10.5)

## 2015-09-03 LAB — URINE MICROSCOPIC-ADD ON

## 2015-09-03 LAB — URINALYSIS, ROUTINE W REFLEX MICROSCOPIC
Glucose, UA: NEGATIVE mg/dL
KETONES UR: 15 mg/dL — AB
NITRITE: NEGATIVE
Protein, ur: 30 mg/dL — AB
SPECIFIC GRAVITY, URINE: 1.035 — AB (ref 1.005–1.030)
pH: 5.5 (ref 5.0–8.0)

## 2015-09-03 LAB — VANCOMYCIN, TROUGH: Vancomycin Tr: 13 ug/mL — ABNORMAL LOW (ref 15–20)

## 2015-09-03 MED ORDER — POTASSIUM CHLORIDE CRYS ER 20 MEQ PO TBCR
40.0000 meq | EXTENDED_RELEASE_TABLET | Freq: Four times a day (QID) | ORAL | Status: AC
  Administered 2015-09-03: 40 meq via ORAL
  Filled 2015-09-03 (×2): qty 2

## 2015-09-03 MED ORDER — POTASSIUM CHLORIDE CRYS ER 20 MEQ PO TBCR
40.0000 meq | EXTENDED_RELEASE_TABLET | Freq: Once | ORAL | Status: AC
  Administered 2015-09-03: 40 meq via ORAL
  Filled 2015-09-03: qty 2

## 2015-09-03 MED ORDER — FUROSEMIDE 10 MG/ML IJ SOLN
40.0000 mg | Freq: Once | INTRAMUSCULAR | Status: AC
  Administered 2015-09-03: 40 mg via INTRAVENOUS
  Filled 2015-09-03: qty 4

## 2015-09-03 MED ORDER — MORPHINE SULFATE (PF) 2 MG/ML IV SOLN
1.0000 mg | Freq: Once | INTRAVENOUS | Status: AC
Start: 2015-09-03 — End: 2015-09-03
  Administered 2015-09-03: 1 mg via INTRAVENOUS
  Filled 2015-09-03: qty 1

## 2015-09-03 MED ORDER — OXYCODONE-ACETAMINOPHEN 5-325 MG PO TABS
1.0000 | ORAL_TABLET | Freq: Four times a day (QID) | ORAL | Status: DC | PRN
  Administered 2015-09-03 – 2015-09-05 (×6): 1 via ORAL
  Filled 2015-09-03 (×6): qty 1

## 2015-09-03 NOTE — Progress Notes (Signed)
Pharmacy Antibiotic Note  Carol Larson is a 45 y.o. female admitted on 09/01/2015 with sepsis and cellulitis.  Pharmacy has been consulted for vancomycin and zosyn dosing. Tmax is 100.2 and WBC wnl. Scr is stable WNL.  VT therapeutic (13) tonight on 1g IV q8h. Goal 10-15  Plan: - Continue vanc 1gm IV Q8H - Zosyn 3.375gm IV Q8H (4 hr inf) - F/u renal fxn, C&S, clinical status - VT as indicated  Height: 5\' 5"  (165.1 cm) Weight: 292 lb 3.2 oz (132.541 kg) IBW/kg (Calculated) : 57  Temp (24hrs), Avg:99.1 F (37.3 C), Min:98 F (36.7 C), Max:100.2 F (37.9 C)   Recent Labs Lab 09/01/15 1836 09/01/15 1846 09/01/15 2042 09/01/15 2303 09/02/15 0801 09/02/15 0813 09/02/15 1118 09/03/15 0711 09/03/15 2014  WBC 11.7*  --   --   --   --  9.5  --  7.5  --   CREATININE 0.63  --   --   --   --  0.81  --  0.80  --   LATICACIDVEN  --  2.37* 2.87* 2.15* 1.5  --  1.0  --   --   VANCOTROUGH  --   --   --   --   --   --   --   --  13*    Estimated Creatinine Clearance: 124.8 mL/min (by C-G formula based on Cr of 0.8).    Allergies  Allergen Reactions  . Latex Itching  . Phenergan [Promethazine Hcl] Other (See Comments)    Involuntary movements    Antimicrobials this admission: Vanc 7/7>> Zosyn 7/7>>  Dose adjustments this admission: 7/9 VT: 13 on 1g IV q8h - no change  Microbiology results: 7/7 BCx2: ngtd 7/9 UC:  Carol BertinHaley Nadalyn Larson, PharmD, [facility] Clinical Pharmacist Pager [phone] 09/03/2015 9:25 PM

## 2015-09-03 NOTE — Progress Notes (Signed)
PROGRESS NOTE  Carol Larson  WUJ:[id number] DOB: 03-19-1970 DOA: 09/01/2015 PCP: No PCP Per Patient Outpatient Specialists:  Subjective: Still has low-grade fever, pain and redness in the left lower extremity  Brief Narrative:  45 year old female morbidly obese came in with symptoms of subjective fever chills and fatigue, left lower extremity cellulitis. Admitted for IV antibiotics.  Assessment & Plan:   Principal Problem:   Sepsis (HCC) Active Problems:   Cellulitis of leg, left   Symptomatic anemia   Dental infection   Status post gastric bypass for obesity   Obstructive sleep apnea   Sepsis -Patient found to be febrile to 102.48F, WBC 11.7, and lactic acid 2.87 and left lower extremity cellulitis. -Sepsis protocol initiated, this is likely secondary to the lower extremity cellulitis. -Blood cultures obtained, continue empiric antibiotics with vancomycin and Zosyn.  Left lower extremity cellulitis -Patient reported recurrent left lower extremity cellulitis, started on empiric vancomycin and Zosyn. -Control pain with hydrocodone, lactic acid normalized. Leukocytosis improved since yesterday. -Elevate the lower extremity, I will give Lasix today because of the swelling and the LLE.  Symptomatic Anemia:  -Patient with a hemoglobin of 6.7 on admission with complaints of shortness of breath and fatigue.  -Found to be vitamin B12 and iron deficient. -Status post transfusion 2 unit of RBCs, hemoglobin improved to 7.8. -Given IV Feraheme and subcutaneous B12, no evidence of overt bleeding.  Dental infection and pain:  -Acute. Gum swelling around the right lower molar which has dental caries. -Advised patient of the need to follow-up with oral surgery as an outpatient  Status post gastric bypass:  -Patient had a seizure back in 2005 notes that she's had at least a 30 pound weight gain over the last 7 months.  Obstructive sleep apnea Hx:  -Patient previously noted having a  CPAP machine years ago, but currently no longer uses. -Continuous pulse oximetry.  Hypokalemia -Potassium is 3.1, wall replete aggressively with oral supplements. -I will give Lasix today because of the swelling and the LLE.   DVT prophylaxis:  Code Status: Full Code Family Communication:  Disposition Plan:  Diet: Diet Heart Room service appropriate?: Yes; Fluid consistency:: Thin  Consultants:   None  Procedures:   None  Antimicrobials:   IV vancomycin and Zosyn   Objective: Filed Vitals:   09/02/15 1536 09/02/15 1815 09/02/15 2130 09/03/15 0515  BP: 120/66 105/56 125/70 117/70  Pulse: 78 78 85 69  Temp: 99 F (37.2 C) 99 F (37.2 C) 98.6 F (37 C) 99.7 F (37.6 C)  TempSrc: Oral Oral Oral Oral  Resp: Height:      Weight:    132.541 kg (292 lb 3.2 oz)  SpO2: 100% 100% 98% 99%    Intake/Output Summary (Last 24 hours) at 09/03/15 1135 Last data filed at 09/03/15 0900  Gross per 24 hour  Intake   2245 ml  Output      3 ml  Net   2242 ml   Filed Weights   09/01/15 1833 09/02/15 0104 09/03/15 0515  Weight: 124.739 kg (275 lb) 132.087 kg (291 lb 3.2 oz) 132.541 kg (292 lb 3.2 oz)    Examination: General exam: Appears calm and comfortable  Respiratory system: Clear to auscultation. Respiratory effort normal. Cardiovascular system: S1 & S2 heard, RRR. No JVD, murmurs, rubs, gallops or clicks. No pedal edema. Gastrointestinal system: Abdomen is nondistended, soft and nontender. No organomegaly or masses felt. Normal bowel sounds heard. Central nervous system: Alert  and oriented. No focal neurological deficits. Extremities: Symmetric 5 x 5 power. Skin: No rashes, lesions or ulcers Psychiatry: Judgement and insight appear normal. Mood & affect appropriate.   Data Reviewed: I have personally reviewed following labs and imaging studies  CBC:  Recent Labs Lab 09/01/15 1836 09/02/15 0813 09/03/15 0711  WBC 11.7* 9.5 7.5  HGB 6.7* 7.0* 7.8*    HCT 23.7* 24.6* 26.6*  MCV 72.7* 74.8* 76.9*  PLT 247 202 202   Basic Metabolic Panel:  Recent Labs Lab 09/01/15 1836 09/02/15 0813 09/03/15 0711  NA 133* 136 135  K 3.8 3.2* 3.1*  CL 109 110 107  CO2 17* 20* 22  GLUCOSE 133* 122* 95  BUN 6 7 6   CREATININE 0.63 0.81 0.80  CALCIUM 8.5* 8.0* 8.6*   GFR: Estimated Creatinine Clearance: 124.8 mL/min (by C-G formula based on Cr of 0.8). Liver Function Tests:  Recent Labs Lab 09/02/15 0813  AST 25  ALT 14  ALKPHOS 56  BILITOT 0.6  PROT 6.0*  ALBUMIN 2.7*   No results for input(s): LIPASE, AMYLASE in the last 168 hours. No results for input(s): AMMONIA in the last 168 hours. Coagulation Profile: No results for input(s): INR, PROTIME in the last 168 hours. Cardiac Enzymes: No results for input(s): CKTOTAL, CKMB, CKMBINDEX, TROPONINI in the last 168 hours. BNP (last 3 results) No results for input(s): PROBNP in the last 8760 hours. HbA1C: No results for input(s): HGBA1C in the last 72 hours. CBG:  Recent Labs Lab 09/01/15 1836  GLUCAP 133*   Lipid Profile: No results for input(s): CHOL, HDL, LDLCALC, TRIG, CHOLHDL, LDLDIRECT in the last 72 hours. Thyroid Function Tests:  Recent Labs  09/02/15 0105  TSH 1.174   Anemia Panel:  Recent Labs  09/01/15 2034  VITAMINB12 71*  FOLATE 7.4  FERRITIN 3*  TIBC 427  IRON 12*  RETICCTPCT 1.1   Urine analysis:    Component Value Date/Time   COLORURINE RED* 09/03/2015 0541   APPEARANCEUR TURBID* 09/03/2015 0541   LABSPEC 1.035* 09/03/2015 0541   PHURINE 5.5 09/03/2015 0541   GLUCOSEU NEGATIVE 09/03/2015 0541   HGBUR LARGE* 09/03/2015 0541   BILIRUBINUR SMALL* 09/03/2015 0541   KETONESUR 15* 09/03/2015 0541   PROTEINUR 30* 09/03/2015 0541   NITRITE NEGATIVE 09/03/2015 0541   LEUKOCYTESUR SMALL* 09/03/2015 0541   Sepsis Labs: @LABRCNTIP (procalcitonin:4,lacticidven:4)  ) Recent Results (from the past 240 hour(s))  Blood Culture (routine x 2)      Status: None (Preliminary result)   Collection Time: 09/01/15  7:56 PM  Result Value Ref Range Status   Specimen Description BLOOD RIGHT FOREARM  Final   Special Requests BOTTLES DRAWN AEROBIC AND ANAEROBIC 5CC  Final   Culture NO GROWTH < 24 HOURS  Final   Report Status PENDING  Incomplete  Blood Culture (routine x 2)     Status: None (Preliminary result)   Collection Time: 09/01/15  8:10 PM  Result Value Ref Range Status   Specimen Description BLOOD LEFT ANTECUBITAL  Final   Special Requests BOTTLES DRAWN AEROBIC AND ANAEROBIC 5CC  Final   Culture NO GROWTH < 24 HOURS  Final   Report Status PENDING  Incomplete     Invalid input(s): PROCALCITONIN, LACTICACIDVEN   Radiology Studies: Dg Chest 2 View  09/01/2015  CLINICAL DATA:  Shortness of breath. Fatigue, chills, bilateral lower extremity swelling today. EXAM: CHEST  2 VIEW COMPARISON:  04/12/2015 FINDINGS: Borderline cardiomegaly. Mediastinal contours are unchanged. Central bronchial thickening and perihilar atelectasis is  chronic and unchanged from prior. There is no pulmonary edema, confluent airspace disease, pleural effusion or pneumothorax. Unchanged osseous structures. IMPRESSION: Chronic bronchial thickening.  No new abnormalities seen. Electronically Signed   By: Rubye Oaks M.D.   On: 09/01/2015 21:07        Scheduled Meds: . cyanocobalamin  1,000 mcg Subcutaneous Daily  . enoxaparin (LOVENOX) injection  40 mg Subcutaneous Daily  . piperacillin-tazobactam (ZOSYN)  IV  3.375 g Intravenous Q8H  . potassium chloride  40 mEq Oral Once  . sodium chloride flush  3 mL Intravenous Q12H  . vancomycin  1,000 mg Intravenous Q8H   Continuous Infusions:     LOS: 2 days    Time spent: 35 minutes    Mozetta Murfin A, MD [facility] Pager [phone]  If 7PM-7AM, please contact night-coverage [email or web address] Password [email or web address] 09/03/2015, 11:35 AM     n

## 2015-09-04 DIAGNOSIS — D519 Vitamin B12 deficiency anemia, unspecified: Secondary | ICD-10-CM

## 2015-09-04 LAB — BASIC METABOLIC PANEL
ANION GAP: 5 (ref 5–15)
BUN: <5 mg/dL — ABNORMAL LOW (ref 6–20)
CO2: 23 mmol/L (ref 22–32)
Calcium: 8.3 mg/dL — ABNORMAL LOW (ref 8.9–10.3)
Chloride: 108 mmol/L (ref 101–111)
Creatinine, Ser: 0.66 mg/dL (ref 0.44–1.00)
GLUCOSE: 94 mg/dL (ref 65–99)
Potassium: 3.4 mmol/L — ABNORMAL LOW (ref 3.5–5.1)
SODIUM: 136 mmol/L (ref 135–145)

## 2015-09-04 LAB — CBC
HEMATOCRIT: 25.4 % — AB (ref 36.0–46.0)
HEMOGLOBIN: 7.5 g/dL — AB (ref 12.0–15.0)
MCH: 22.3 pg — ABNORMAL LOW (ref 26.0–34.0)
MCHC: 29.5 g/dL — ABNORMAL LOW (ref 30.0–36.0)
MCV: 75.6 fL — ABNORMAL LOW (ref 78.0–100.0)
Platelets: 209 10*3/uL (ref 150–400)
RBC: 3.36 MIL/uL — ABNORMAL LOW (ref 3.87–5.11)
RDW: 22 % — ABNORMAL HIGH (ref 11.5–15.5)
WBC: 6.9 10*3/uL (ref 4.0–10.5)

## 2015-09-04 LAB — HEMOGLOBIN A1C
HEMOGLOBIN A1C: 6 % — AB (ref 4.8–5.6)
MEAN PLASMA GLUCOSE: 126 mg/dL

## 2015-09-04 LAB — URINE CULTURE: CULTURE: NO GROWTH

## 2015-09-04 MED ORDER — POTASSIUM CHLORIDE CRYS ER 20 MEQ PO TBCR
60.0000 meq | EXTENDED_RELEASE_TABLET | Freq: Four times a day (QID) | ORAL | Status: AC
  Administered 2015-09-04 (×2): 60 meq via ORAL
  Filled 2015-09-04 (×2): qty 3

## 2015-09-04 MED ORDER — FUROSEMIDE 10 MG/ML IJ SOLN
40.0000 mg | Freq: Four times a day (QID) | INTRAMUSCULAR | Status: AC
  Administered 2015-09-04 (×2): 40 mg via INTRAVENOUS
  Filled 2015-09-04 (×2): qty 4

## 2015-09-04 NOTE — Care Management Note (Signed)
Case Management Note  Patient Details  Name: Carol Larson MRN: [id number] Date of Birth: September 08, 1970  Subjective/Objective:        Admitted with Sepsis            Action/Plan: Patient is independent of all ADL's, works full time at [employer]; she chose not have health insurance at her job but plans to enroll during the enrollment period in Oct; [facility] She can get her prescriptions filled there at discharge also. Patient also stated that she works long hours but plans to start exercising and betting a heart health diet.  Expected Discharge Date:    possibly 09/05/2015              Expected Discharge Plan:  Home/Self Care  In-House Referral:   Financial Counselor  Discharge planning Services  CM Consult  Status of Service:  In process, will continue to follow  Reola MosherChandler, Kristofer Schaffert L, RN,MHA,BSN [phone] 09/04/2015, 11:02 AM

## 2015-09-04 NOTE — Progress Notes (Signed)
PROGRESS NOTE  Carol Larson  ZOX:[id number] DOB: 08-29-1970 DOA: 09/01/2015 PCP: No PCP Per Patient Outpatient Specialists:  Subjective: Fever curve is going down, no leukocytosis, complaining about severe pain when she stand up and bear weight on her leg. 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  Brief Narrative:  45 year old female morbidly obese came in with symptoms of subjective fever chills and fatigue, left lower extremity cellulitis. Admitted for IV antibiotics.  Assessment & Plan:   Principal Problem:   Sepsis (HCC) Active Problems:   Cellulitis of leg, left   Symptomatic anemia   Dental infection   Status post gastric bypass for obesity   Obstructive sleep apnea   Absolute anemia   Cellulitis of left lower extremity   Pyrexia   Leukocytosis   Sepsis -Patient found to be febrile to 102.52F, WBC 11.7, and lactic acid 2.87 and left lower extremity cellulitis. -Sepsis protocol initiated, this is likely secondary to the lower extremity cellulitis. -Blood cultures obtained, continue empiric antibiotics with vancomycin and Zosyn. -Overall improving, continue antibiotics for today likely can be discharged as  Left lower extremity cellulitis -Patient reported recurrent left lower extremity cellulitis, started on empiric vancomycin and Zosyn. -Control pain with hydrocodone, lactic acid normalized. Leukocytosis improved since yesterday. -Elevate the lower extremity, the Lasix with potassium supplement today.  Symptomatic Anemia:  -Patient with a hemoglobin of 6.7 on admission with complaints of shortness of breath and fatigue.  -Found to be vitamin B12 and iron deficient. No evidence of bleeding likely each patient anemia. -Status post transfusion 2 unit of RBCs, hemoglobin improved to 7.8. -Given IV Feraheme and subcutaneous B12, no evidence of overt bleeding.  Dental infection and pain:  -Acute. Gum swelling around the right  lower molar which has dental caries. -Advised patient of the need to follow-up with oral surgery as an outpatient  Status post gastric bypass:  -Patient had a seizure back in 2005 notes that she's had at least a 30 pound weight gain over the last 7 months.  Obstructive sleep apnea Hx:  -Patient previously noted having a CPAP machine years ago, but currently no longer uses. -Continuous pulse oximetry.  Hypokalemia -Potassium is 3.1, wall replete aggressively with oral supplements.   DVT prophylaxis:  Code Status: Full Code Family Communication:  Disposition Plan:  Diet: Diet Heart Room service appropriate?: Yes; Fluid consistency:: Thin  Consultants:   None  Procedures:   None  Antimicrobials:   IV vancomycin and Zosyn   Objective: Filed Vitals:   09/03/15 1241 09/03/15 2011 09/04/15 0551 09/04/15 1213  BP: 118/80 119/76 114/65 119/69  Pulse: 69 74 59 53  Temp: 98 F (36.7 C) 100.2 F (37.9 C) 98 F (36.7 C) 98.5 F (36.9 C)  TempSrc: Oral Oral Oral Oral  Resp: 18 18 18 18   Height:      Weight:   129.956 kg (286 lb 8 oz)   SpO2: 100% 99% 100% 100%    Intake/Output Summary (Last 24 hours) at 09/04/15 1219 Last data filed at 09/04/15 0854  Gross per 24 hour  Intake    970 ml  Output   1300 ml  Net   -330 ml   Filed Weights   09/02/15 0104 09/03/15 0515 09/04/15 0551  Weight: 132.087 kg (291 lb 3.2 oz) 132.541 kg (292 lb 3.2 oz) 129.956 kg (286 lb 8 oz)    Examination: General exam: Appears calm and comfortable  Respiratory system:  Clear to auscultation. Respiratory effort normal. Cardiovascular system: S1 & S2 heard, RRR. No JVD, murmurs, rubs, gallops or clicks. No pedal edema. Gastrointestinal system: Abdomen is nondistended, soft and nontender. No organomegaly or masses felt. Normal bowel sounds heard. Central nervous system: Alert and oriented. No focal neurological deficits. Extremities: Symmetric 5 x 5 power. Skin: No rashes, lesions or  ulcers Psychiatry: Judgement and insight appear normal. Mood & affect appropriate.   Data Reviewed: I have personally reviewed following labs and imaging studies  CBC:  Recent Labs Lab 09/01/15 1836 09/02/15 0813 09/03/15 0711 09/04/15 0258  WBC 11.7* 9.5 7.5 6.9  HGB 6.7* 7.0* 7.8* 7.5*  HCT 23.7* 24.6* 26.6* 25.4*  MCV 72.7* 74.8* 76.9* 75.6*  PLT 247 202 202 209   Basic Metabolic Panel:  Recent Labs Lab 09/01/15 1836 09/02/15 0813 09/03/15 0711 09/04/15 0258  NA 133* 136 135 136  K 3.8 3.2* 3.1* 3.4*  CL 109 110 107 108  CO2 17* 20* 22 23  GLUCOSE 133* 122* 95 94  BUN <5*  CREATININE 0.63 0.81 0.80 0.66  CALCIUM 8.5* 8.0* 8.6* 8.3*   GFR: Estimated Creatinine Clearance: 123.4 mL/min (by C-G formula based on Cr of 0.66). Liver Function Tests:  Recent Labs Lab 09/02/15 0813  AST 25  ALT 14  ALKPHOS 56  BILITOT 0.6  PROT 6.0*  ALBUMIN 2.7*   No results for input(s): LIPASE, AMYLASE in the last 168 hours. No results for input(s): AMMONIA in the last 168 hours. Coagulation Profile: No results for input(s): INR, PROTIME in the last 168 hours. Cardiac Enzymes: No results for input(s): CKTOTAL, CKMB, CKMBINDEX, TROPONINI in the last 168 hours. BNP (last 3 results) No results for input(s): PROBNP in the last 8760 hours. HbA1C:  Recent Labs  09/02/15 0530  HGBA1C 6.0*   CBG:  Recent Labs Lab 09/01/15 1836  GLUCAP 133*   Lipid Profile: No results for input(s): CHOL, HDL, LDLCALC, TRIG, CHOLHDL, LDLDIRECT in the last 72 hours. Thyroid Function Tests:  Recent Labs  09/02/15 0105  TSH 1.174   Anemia Panel:  Recent Labs  09/01/15 2034  VITAMINB12 71*  FOLATE 7.4  FERRITIN 3*  TIBC 427  IRON 12*  RETICCTPCT 1.1   Urine analysis:    Component Value Date/Time   COLORURINE RED* 09/03/2015 0541   APPEARANCEUR TURBID* 09/03/2015 0541   LABSPEC 1.035* 09/03/2015 0541   PHURINE 5.5 09/03/2015 0541   GLUCOSEU NEGATIVE 09/03/2015  0541   HGBUR LARGE* 09/03/2015 0541   BILIRUBINUR SMALL* 09/03/2015 0541   KETONESUR 15* 09/03/2015 0541   PROTEINUR 30* 09/03/2015 0541   NITRITE NEGATIVE 09/03/2015 0541   LEUKOCYTESUR SMALL* 09/03/2015 0541   Sepsis Labs: (procalcitonin:4,lacticidven:4)  ) Recent Results (from the past 240 hour(s))  Blood Culture (routine x 2)     Status: None (Preliminary result)   Collection Time: 09/01/15  7:56 PM  Result Value Ref Range Status   Specimen Description BLOOD RIGHT FOREARM  Final   Special Requests BOTTLES DRAWN AEROBIC AND ANAEROBIC 5CC  Final   Culture NO GROWTH 2 DAYS  Final   Report Status PENDING  Incomplete  Blood Culture (routine x 2)     Status: None (Preliminary result)   Collection Time: 09/01/15  8:10 PM  Result Value Ref Range Status   Specimen Description BLOOD LEFT ANTECUBITAL  Final   Special Requests BOTTLES DRAWN AEROBIC AND ANAEROBIC 5CC  Final   Culture NO GROWTH 2 DAYS  Final  Report Status PENDING  Incomplete  Urine culture     Status: None   Collection Time: 09/03/15  5:42 AM  Result Value Ref Range Status   Specimen Description URINE, CLEAN CATCH  Final   Special Requests NONE  Final   Culture NO GROWTH  Final   Report Status 09/04/2015 FINAL  Final     Invalid input(s): PROCALCITONIN, LACTICACIDVEN   Radiology Studies: No results found.      Scheduled Meds: . cyanocobalamin  1,000 mcg Subcutaneous Daily  . enoxaparin (LOVENOX) injection  40 mg Subcutaneous Daily  . furosemide  40 mg Intravenous Q6H  . piperacillin-tazobactam (ZOSYN)  IV  3.375 g Intravenous Q8H  . potassium chloride  60 mEq Oral Q6H  . sodium chloride flush  3 mL Intravenous Q12H  . vancomycin  1,000 mg Intravenous Q8H   Continuous Infusions:     LOS: 3 days    Time spent: 35 minutes    Silviano Neuser A, MD [facility] Pager [phone]  If 7PM-7AM, please contact night-coverage [email or web address] Password [email or web address] 09/04/2015, 12:19 PM      n

## 2015-09-04 NOTE — Clinical Social Work Note (Signed)
CSW acknowledges consult "Patient with overall poor follow-up and Care of her medical issues please assess needs." Will notify RNCM.  CSW signing off. Consult again if any social work needs arise.  Charlynn CourtSarah Kerolos Nehme, CSW [phone]

## 2015-09-05 LAB — BASIC METABOLIC PANEL
ANION GAP: 5 (ref 5–15)
BUN: 7 mg/dL (ref 6–20)
CALCIUM: 8.6 mg/dL — AB (ref 8.9–10.3)
CO2: 26 mmol/L (ref 22–32)
CREATININE: 0.7 mg/dL (ref 0.44–1.00)
Chloride: 107 mmol/L (ref 101–111)
Glucose, Bld: 106 mg/dL — ABNORMAL HIGH (ref 65–99)
Potassium: 3.8 mmol/L (ref 3.5–5.1)
SODIUM: 138 mmol/L (ref 135–145)

## 2015-09-05 LAB — CBC
HCT: 26.9 % — ABNORMAL LOW (ref 36.0–46.0)
Hemoglobin: 7.9 g/dL — ABNORMAL LOW (ref 12.0–15.0)
MCH: 22.8 pg — AB (ref 26.0–34.0)
MCHC: 29.4 g/dL — ABNORMAL LOW (ref 30.0–36.0)
MCV: 77.5 fL — ABNORMAL LOW (ref 78.0–100.0)
PLATELETS: 257 10*3/uL (ref 150–400)
RBC: 3.47 MIL/uL — AB (ref 3.87–5.11)
RDW: 22.8 % — ABNORMAL HIGH (ref 11.5–15.5)
WBC: 6.2 10*3/uL (ref 4.0–10.5)

## 2015-09-05 MED ORDER — FUROSEMIDE 20 MG PO TABS: 20.0000 mg | ORAL_TABLET | Freq: Every day | ORAL | Status: AC

## 2015-09-05 MED ORDER — HYDROCODONE-ACETAMINOPHEN 5-325 MG PO TABS: 1.0000 | ORAL_TABLET | Freq: Four times a day (QID) | ORAL | Status: AC | PRN

## 2015-09-05 MED ORDER — CEPHALEXIN 500 MG PO CAPS: 500.0000 mg | ORAL_CAPSULE | Freq: Three times a day (TID) | ORAL | Status: AC

## 2015-09-05 MED ORDER — DOXYCYCLINE HYCLATE 100 MG PO TABS: 100.0000 mg | ORAL_TABLET | Freq: Two times a day (BID) | ORAL | Status: AC

## 2015-09-05 NOTE — Discharge Summary (Signed)
Physician Discharge Summary  Carol Larson ZOX:[id number] DOB: Jan 13, 1971 DOA: 09/01/2015  PCP: No PCP Per Patient  Admit date: 09/01/2015 Discharge date: 09/05/2015  Admitted From: Home Disposition:  Home  Recommendations for Outpatient Follow-up:  1. Follow up with PCP in 1-2 weeks 2. Please obtain BMP/CBC in one week.  Home Health: None Equipment/Devices: None  Discharge Condition: Stable CODE STATUS: Full code Diet recommendation: Heart Healthy  Brief/Interim Summary: Fever curve is going down, no leukocytosis, complaining about severe pain when she stand up and bear weight on her leg. 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  Discharge Diagnoses:  Principal Problem:   Sepsis (HCC) Active Problems:   Cellulitis of leg, left   Symptomatic anemia   Dental infection   Status post gastric bypass for obesity   Obstructive sleep apnea   Absolute anemia   Cellulitis of left lower extremity   Pyrexia   Leukocytosis    Sepsis -Patient found to be febrile to 102.40F, WBC 11.7, and lactic acid 2.87 and left lower extremity cellulitis. -Sepsis protocol initiated, this is likely secondary to the lower extremity cellulitis. -Blood cultures obtained, continue empiric antibiotics with vancomycin and Zosyn. -Sepsis resolved.  Left lower extremity cellulitis -Patient reported recurrent left lower extremity cellulitis, started on empiric vancomycin and Zosyn. -Control pain with hydrocodone, lactic acid normalized. Leukocytosis improved since yesterday. -Had 4 days of IV vancomycin and Zosyn, improving with less warmth and pain. -Discharged on doxycycline and Keflex to complete 14 days of antibiotics. -Also had lower extremity edema, requested Lasix, started on 20 mg of Lasix for chronic edema.  Symptomatic Anemia:  -Patient with a hemoglobin of 6.7 on admission with complaints of shortness of breath and fatigue.  -Found to be vitamin B12  and iron deficient. No evidence of bleeding likely each patient anemia. -Status post transfusion 2 unit of RBCs, hemoglobin improved to 7.8. -Given IV Feraheme and subcutaneous B12, no evidence of overt bleeding.  Dental infection and pain:  -Acute. Gum swelling around the right lower molar which has dental caries. -Advised patient of the need to follow-up with oral surgery as an outpatient  Status post gastric bypass:  -Patient had a seizure back in 2005 notes that she's had at least a 30 pound weight gain over the last 7 months.  Obstructive sleep apnea Hx:  -Patient previously noted having a CPAP machine years ago, but currently no longer uses. -Continuous pulse oximetry.  Hypokalemia -Potassium is 3.1, wall replete aggressively with oral supplements.  Discharge Instructions  Discharge Instructions    Diet - low sodium heart healthy    Complete by:  As directed      Increase activity slowly    Complete by:  As directed             Medication List    TAKE these medications        cephALEXin 500 MG capsule  Commonly known as:  KEFLEX  Take 1 capsule (500 mg total) by mouth 3 (three) times daily.     doxycycline 100 MG tablet  Commonly known as:  VIBRA-TABS  Take 1 tablet (100 mg total) by mouth 2 (two) times daily.     furosemide 20 MG tablet  Commonly known as:  LASIX  Take 1 tablet (20 mg total) by mouth daily.     HYDROcodone-acetaminophen 5-325 MG tablet  Commonly known as:  NORCO/VICODIN  Take 1 tablet by mouth every 6 (six)  hours as needed for moderate pain.           Follow-up Information    Follow up with [facility].   Contact information:   [address] [place] [address] [phone]     Allergies  Allergen Reactions  . Latex Itching  . Phenergan [Promethazine Hcl] Other (See Comments)    Involuntary movements    Consultations:  None   Procedures/Studies: Dg Chest 2  View  09/01/2015  CLINICAL DATA:  Shortness of breath. Fatigue, chills, bilateral lower extremity swelling today. EXAM: CHEST  2 VIEW COMPARISON:  04/12/2015 FINDINGS: Borderline cardiomegaly. Mediastinal contours are unchanged. Central bronchial thickening and perihilar atelectasis is chronic and unchanged from prior. There is no pulmonary edema, confluent airspace disease, pleural effusion or pneumothorax. Unchanged osseous structures. IMPRESSION: Chronic bronchial thickening.  No new abnormalities seen. Electronically Signed   By: Rubye Oaks M.D.   On: 09/01/2015 21:07    (Echo, Carotid, EGD, Colonoscopy, ERCP)    Subjective:   Discharge Exam: Filed Vitals:   09/04/15 2014 09/05/15 0438  BP: 123/68 123/70  Pulse: 68 53  Temp: 99.2 F (37.3 C) 98.1 F (36.7 C)  Resp: 18 18   Filed Vitals:   09/04/15 1213 09/04/15 1845 09/04/15 2014 09/05/15 0438  BP: 119/69  123/68 123/70  Pulse: 53  68 53  Temp: 98.5 F (36.9 C) 99.1 F (37.3 C) 99.2 F (37.3 C) 98.1 F (36.7 C)  TempSrc: Oral Oral Oral Oral  Resp: 18  18 18   Height:      Weight:    127.551 kg (281 lb 3.2 oz)  SpO2: 100%  100% 100%    General: Pt is alert, awake, not in acute distress Cardiovascular: RRR, S1/S2 +, no rubs, no gallops Respiratory: CTA bilaterally, no wheezing, no rhonchi Abdominal: Soft, NT, ND, bowel sounds + Extremities: no edema, no cyanosis    The results of significant diagnostics from this hospitalization (including imaging, microbiology, ancillary and laboratory) are listed below for reference.     Microbiology: Recent Results (from the past 240 hour(s))  Blood Culture (routine x 2)     Status: None (Preliminary result)   Collection Time: 09/01/15  7:56 PM  Result Value Ref Range Status   Specimen Description BLOOD RIGHT FOREARM  Final   Special Requests BOTTLES DRAWN AEROBIC AND ANAEROBIC 5CC  Final   Culture NO GROWTH 3 DAYS  Final   Report Status PENDING  Incomplete  Blood  Culture (routine x 2)     Status: None (Preliminary result)   Collection Time: 09/01/15  8:10 PM  Result Value Ref Range Status   Specimen Description BLOOD LEFT ANTECUBITAL  Final   Special Requests BOTTLES DRAWN AEROBIC AND ANAEROBIC 5CC  Final   Culture NO GROWTH 3 DAYS  Final   Report Status PENDING  Incomplete  Urine culture     Status: None   Collection Time: 09/03/15  5:42 AM  Result Value Ref Range Status   Specimen Description URINE, CLEAN CATCH  Final   Special Requests NONE  Final   Culture NO GROWTH  Final   Report Status 09/04/2015 FINAL  Final     Labs: BNP (last 3 results) No results for input(s): BNP in the last 8760 hours. Basic Metabolic Panel:  Recent Labs Lab 09/01/15 1836 09/02/15 0813 09/03/15 0711 09/04/15 0258 09/05/15 0938  NA 133* 136 135 136 138  K 3.8 3.2* 3.1* 3.4* 3.8  CL  109 110 107 108 107  CO2 17* 20* 22 23 26   GLUCOSE 133* 122* 95 94 106*  BUN 6 7 6  <5* 7  CREATININE 0.63 0.81 0.80 0.66 0.70  CALCIUM 8.5* 8.0* 8.6* 8.3* 8.6*   Liver Function Tests:  Recent Labs Lab 09/02/15 0813  AST 25  ALT 14  ALKPHOS 56  BILITOT 0.6  PROT 6.0*  ALBUMIN 2.7*   No results for input(s): LIPASE, AMYLASE in the last 168 hours. No results for input(s): AMMONIA in the last 168 hours. CBC:  Recent Labs Lab 09/01/15 1836 09/02/15 0813 09/03/15 0711 09/04/15 0258 09/05/15 0938  WBC 11.7* 9.5 7.5 6.9 6.2  HGB 6.7* 7.0* 7.8* 7.5* 7.9*  HCT 23.7* 24.6* 26.6* 25.4* 26.9*  MCV 72.7* 74.8* 76.9* 75.6* 77.5*  PLT 247 202 202 209 257   Cardiac Enzymes: No results for input(s): CKTOTAL, CKMB, CKMBINDEX, TROPONINI in the last 168 hours. BNP: Invalid input(s): POCBNP CBG:  Recent Labs Lab 09/01/15 1836  GLUCAP 133*   D-Dimer No results for input(s): DDIMER in the last 72 hours. Hgb A1c No results for input(s): HGBA1C in the last 72 hours. Lipid Profile No results for input(s): CHOL, HDL, LDLCALC, TRIG, CHOLHDL, LDLDIRECT in the last 72  hours. Thyroid function studies No results for input(s): TSH, T4TOTAL, T3FREE, THYROIDAB in the last 72 hours.  Invalid input(s): FREET3 Anemia work up No results for input(s): VITAMINB12, FOLATE, FERRITIN, TIBC, IRON, RETICCTPCT in the last 72 hours. Urinalysis    Component Value Date/Time   COLORURINE RED* 09/03/2015 0541   APPEARANCEUR TURBID* 09/03/2015 0541   LABSPEC 1.035* 09/03/2015 0541   PHURINE 5.5 09/03/2015 0541   GLUCOSEU NEGATIVE 09/03/2015 0541   HGBUR LARGE* 09/03/2015 0541   BILIRUBINUR SMALL* 09/03/2015 0541   KETONESUR 15* 09/03/2015 0541   PROTEINUR 30* 09/03/2015 0541   NITRITE NEGATIVE 09/03/2015 0541   LEUKOCYTESUR SMALL* 09/03/2015 0541   Sepsis Labs Invalid input(s): PROCALCITONIN,  WBC,  LACTICIDVEN Microbiology Recent Results (from the past 240 hour(s))  Blood Culture (routine x 2)     Status: None (Preliminary result)   Collection Time: 09/01/15  7:56 PM  Result Value Ref Range Status   Specimen Description BLOOD RIGHT FOREARM  Final   Special Requests BOTTLES DRAWN AEROBIC AND ANAEROBIC 5CC  Final   Culture NO GROWTH 3 DAYS  Final   Report Status PENDING  Incomplete  Blood Culture (routine x 2)     Status: None (Preliminary result)   Collection Time: 09/01/15  8:10 PM  Result Value Ref Range Status   Specimen Description BLOOD LEFT ANTECUBITAL  Final   Special Requests BOTTLES DRAWN AEROBIC AND ANAEROBIC 5CC  Final   Culture NO GROWTH 3 DAYS  Final   Report Status PENDING  Incomplete  Urine culture     Status: None   Collection Time: 09/03/15  5:42 AM  Result Value Ref Range Status   Specimen Description URINE, CLEAN CATCH  Final   Special Requests NONE  Final   Culture NO GROWTH  Final   Report Status 09/04/2015 FINAL  Final     Time coordinating discharge: Over 30 minutes  SIGNED:   Clint LippsELMAHI,Leyan Branden A, MD  [facility] 09/05/2015, 1:39 PM Pager   If 7PM-7AM, please contact night-coverage [email or web address] Password [email or web address]

## 2015-09-05 NOTE — Progress Notes (Signed)
Pt has orders to be discharged. Discharge instructions given and pt has no additional questions at this time. Medication regimen reviewed and pt educated. Pt verbalized understanding and has no additional questions. Telemetry box removed. IV removed and site in good condition. Pt stable and waiting for transportation. 

## 2015-09-06 LAB — CULTURE, BLOOD (ROUTINE X 2)
CULTURE: NO GROWTH
Culture: NO GROWTH

## 2016-12-16 IMAGING — DX DG CHEST 2V
2 series · 2 of 2 positions shown · non-contrast
Comparison: 04/12/2015

CLINICAL DATA: Shortness of breath. Fatigue, chills, bilateral
lower extremity swelling today.

EXAM:
CHEST  2 VIEW

[w chest lat]
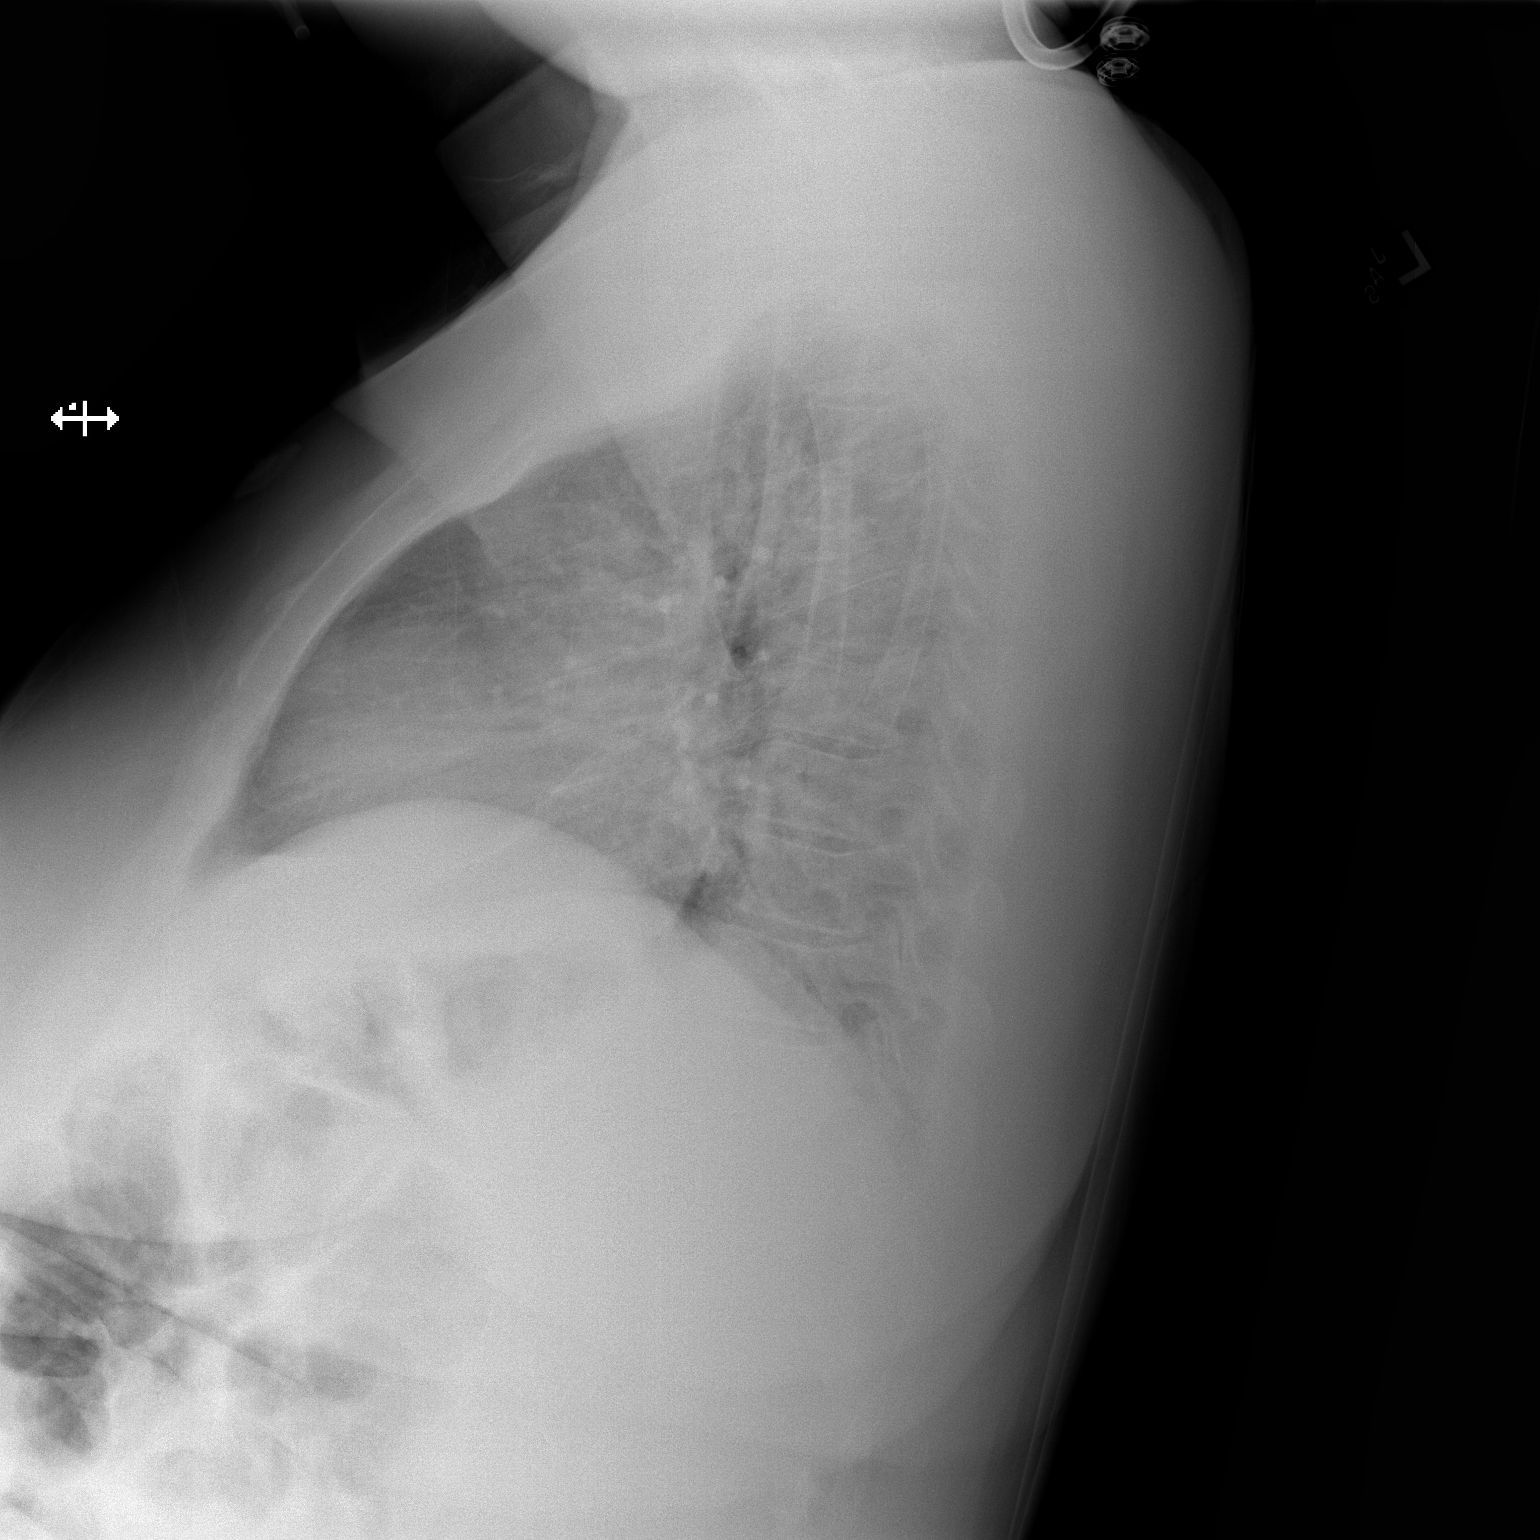

[x chest ap]
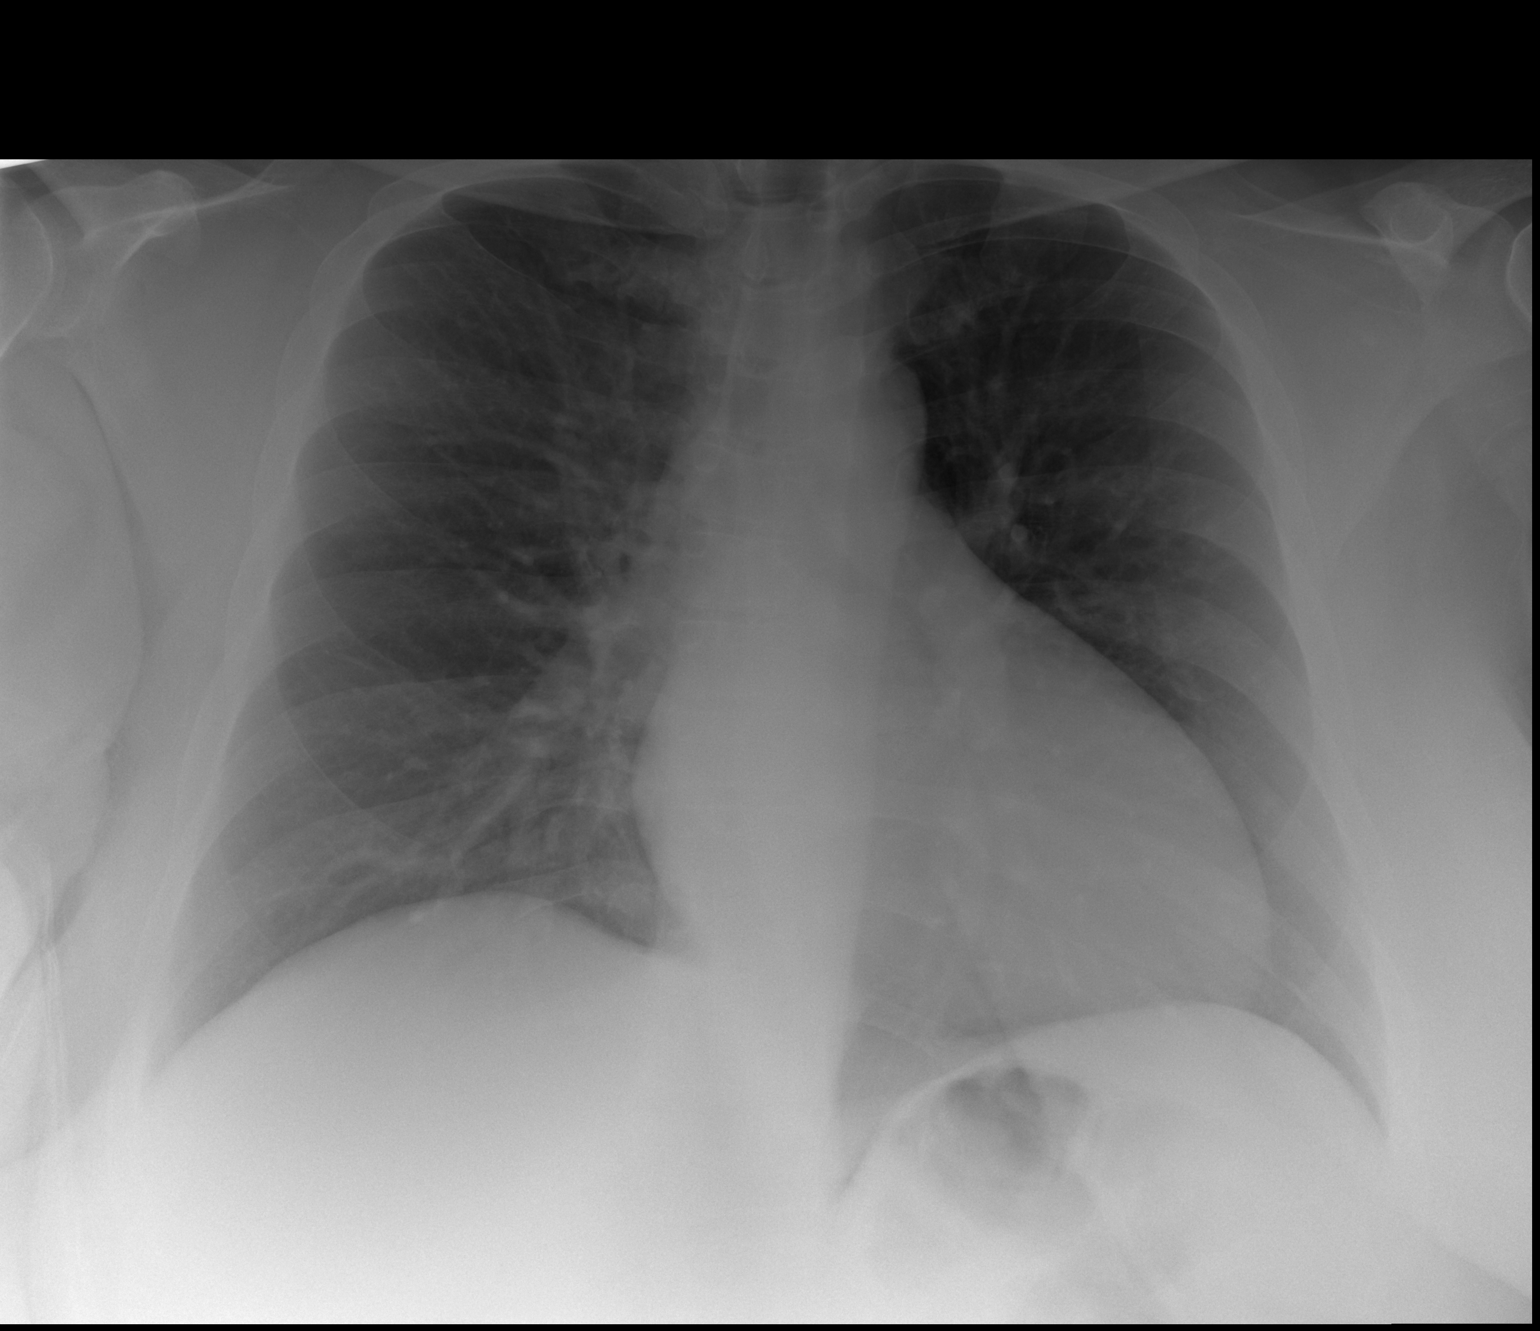

[2 of 2 positions shown; findings below may reference images not displayed]

FINDINGS: Borderline cardiomegaly. Mediastinal contours are unchanged. Central
bronchial thickening and perihilar atelectasis is chronic and
unchanged from prior. There is no pulmonary edema, confluent
airspace disease, pleural effusion or pneumothorax. Unchanged
osseous structures.
IMPRESSION: Chronic bronchial thickening.  No new abnormalities seen.
